# Patient Record
Sex: Female | Born: 1955 | Race: White | Hispanic: No | Marital: Married | Smoking: Never smoker
Health system: Southern US, Community
[De-identification: ages and names within clinical notes are randomized; demographics above are authoritative.]

## PROBLEM LIST (undated history)

## (undated) DIAGNOSIS — H9190 Unspecified hearing loss, unspecified ear: Secondary | ICD-10-CM

## (undated) DIAGNOSIS — J309 Allergic rhinitis, unspecified: Secondary | ICD-10-CM

## (undated) DIAGNOSIS — E079 Disorder of thyroid, unspecified: Secondary | ICD-10-CM

## (undated) DIAGNOSIS — I2510 Atherosclerotic heart disease of native coronary artery without angina pectoris: Secondary | ICD-10-CM

## (undated) DIAGNOSIS — J329 Chronic sinusitis, unspecified: Secondary | ICD-10-CM

## (undated) DIAGNOSIS — R519 Headache, unspecified: Secondary | ICD-10-CM

## (undated) DIAGNOSIS — I38 Endocarditis, valve unspecified: Secondary | ICD-10-CM

## (undated) DIAGNOSIS — E785 Hyperlipidemia, unspecified: Secondary | ICD-10-CM

## (undated) DIAGNOSIS — I10 Essential (primary) hypertension: Secondary | ICD-10-CM

## (undated) DIAGNOSIS — E669 Obesity, unspecified: Secondary | ICD-10-CM

## (undated) DIAGNOSIS — R12 Heartburn: Secondary | ICD-10-CM

## (undated) DIAGNOSIS — E7800 Pure hypercholesterolemia, unspecified: Secondary | ICD-10-CM

## (undated) HISTORY — PX: HX CATARACT REMOVAL: SHX102

## (undated) HISTORY — PX: COCHLEAR IMPLANT: SUR684

## (undated) HISTORY — DX: Essential (primary) hypertension: I10

## (undated) HISTORY — PX: HX TONSILLECTOMY: SHX27

## (undated) HISTORY — DX: Chronic sinusitis, unspecified: J32.9

## (undated) HISTORY — PX: HX EYE SURGERY: 2100001143

## (undated) HISTORY — DX: Unspecified hearing loss, unspecified ear: H91.90

## (undated) HISTORY — DX: Allergic rhinitis, unspecified: J30.9

## (undated) HISTORY — PX: HX WISDOM TEETH EXTRACTION: SHX21

## (undated) HISTORY — PX: HX COLONOSCOPY: 2100001147

## (undated) HISTORY — DX: Headache, unspecified: R51.9

## (undated) HISTORY — PX: HX HYSTERECTOMY: SHX81

## (undated) HISTORY — DX: Disorder of thyroid, unspecified: E07.9

## (undated) HISTORY — PX: IMPLANTATION BONE ANCHORED HEARING AID: SUR691

## (undated) HISTORY — DX: Atherosclerotic heart disease of native coronary artery without angina pectoris: I25.10

## (undated) HISTORY — PX: OTHER SURGICAL HISTORY: SHX169

## (undated) HISTORY — PX: PARTIAL HYSTERECTOMY: SHX80

---

## 2000-12-24 ENCOUNTER — Other Ambulatory Visit (HOSPITAL_COMMUNITY): Payer: Self-pay | Admitting: Family

## 2013-05-30 ENCOUNTER — Encounter (INDEPENDENT_AMBULATORY_CARE_PROVIDER_SITE_OTHER): Payer: Self-pay | Admitting: Audiologist

## 2013-05-30 ENCOUNTER — Ambulatory Visit (INDEPENDENT_AMBULATORY_CARE_PROVIDER_SITE_OTHER): Payer: BC Managed Care – PPO | Admitting: Audiologist

## 2013-05-30 ENCOUNTER — Ambulatory Visit (INDEPENDENT_AMBULATORY_CARE_PROVIDER_SITE_OTHER): Payer: BC Managed Care – PPO | Admitting: Otolaryngology

## 2013-05-30 ENCOUNTER — Encounter (INDEPENDENT_AMBULATORY_CARE_PROVIDER_SITE_OTHER): Payer: Self-pay | Admitting: Otolaryngology

## 2013-05-30 VITALS — BP 136/86 | HR 73 | Temp 98.7°F | Ht <= 58 in | Wt 160.4 lb

## 2013-05-30 DIAGNOSIS — H9190 Unspecified hearing loss, unspecified ear: Secondary | ICD-10-CM

## 2013-05-30 DIAGNOSIS — R4789 Other speech disturbances: Secondary | ICD-10-CM

## 2013-05-30 DIAGNOSIS — H903 Sensorineural hearing loss, bilateral: Secondary | ICD-10-CM

## 2013-05-30 DIAGNOSIS — H905 Unspecified sensorineural hearing loss: Principal | ICD-10-CM

## 2013-05-30 DIAGNOSIS — H93299 Other abnormal auditory perceptions, unspecified ear: Secondary | ICD-10-CM
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
## 2013-05-30 NOTE — Progress Notes (Signed)
Patient was sent over today by Dr Fonnie Mu to discuss a Baha hearing device. Using the softband, I placed the BP110 Power processor behind the patient's left ear. I plugged her right ear with a foam insert. I had her husband talk to her on the left side. She reports that if he was on her left side before, she would turn her head so that her right ear was facing toward him more. He was not talking very loud, below conversational level and she was able to hear him well. He even stepped just to the door, about five feet away, and talked to her. She was able to hear him without turning her head. She was impressed with the device. She is going to think about the device. She is a little concerned about the surgical side of the process. I encouraged her to call and speak the the physician about questions with the surgery. I will check with Ginger Organ about insurance and call the patient back. She is to let us know what she decides about the device.    TRH

## 2013-05-31 ENCOUNTER — Encounter (INDEPENDENT_AMBULATORY_CARE_PROVIDER_SITE_OTHER): Payer: Self-pay | Admitting: Audiologist

## 2013-05-31 NOTE — Progress Notes (Signed)
I spoke with patient this morning. Yesterday, she had questions about insurance coverage. I spoke with Ginger Organ at the front desk and she states that she is not the one that does pre certification. She said that once the patient decides to proceed, she would get her scheduled for surgery and then someone else calls to verify coverage. Kelly recommended that the patient call her insurance on her own and find out if she is covered for the surgery. I also spoke with Dr Jocelyn Lamer about processor types and he recommended the Baha 5 device for this patient. Patient said that she would talk to her regular ear doctor about the surgery and also call her insurance to see what they say. She is going to call us back and let us know what she decides. Since the power processor is not needed, patient states that she is interested in the Attract system, not the Connect system.     TRH

## 2013-06-05 ENCOUNTER — Ambulatory Visit (INDEPENDENT_AMBULATORY_CARE_PROVIDER_SITE_OTHER): Payer: Self-pay | Admitting: Audiologist

## 2013-06-05 ENCOUNTER — Encounter (INDEPENDENT_AMBULATORY_CARE_PROVIDER_SITE_OTHER): Payer: Self-pay | Admitting: Otolaryngology

## 2013-06-05 NOTE — H&P (Signed)
[facility], [place]  [place] [address]  [phone]    PATIENT NAME:  Molly [name]  MRN:  [id number]  DOB:  December 28, 1955  DATE OF SERVICE: 05/30/2013    Chief Complaint:  Hearing Loss    HPI:  Nialah Saravia is a 58 y.o. female who was sent here for consultation by Dr. Joya Martyr because of profound hearing loss in the right ear.  She states that she lost 70% of her hearing in 1988 and the hearing progressed after that although in parts of her chart it mentioned she had sudden hearing loss at that time.  She notices occasional unsteadiness.  She denies problems hearing out of her right ear.  She notices constant tinnitus on the left side.      Past Medical History:  Past Medical History   Diagnosis Date    Allergic rhinitis     HTN (hypertension)     Thyroid disease     Hearing loss     Coronary artery disease        Past Surgical History:  Past Surgical History   Procedure Laterality Date    Hx tonsillectomy         Family History:  Family History   Problem Relation Age of Onset    Bleeding Prob Mother     Cancer Mother     Diabetes Mother     Heart Disease Mother     Hypertension Mother        Social History:  History   Smoking status    Never Smoker    Smokeless tobacco    Never Used     History   Alcohol Use No     Social History     Occupational History    Not on file.       Medications:  Outpatient Prescriptions Marked as Taking for the 05/30/13 encounter (Office Visit) with Eldred Manges, MD   Medication Sig    aspirin (ECOTRIN) 81 mg Oral Tablet, Delayed Release (E.C.) Take 81 mg by mouth Once a day    Atenolol-Chlorthalidone (TENORETIC) 50-25 mg Oral Tablet     clopidogrel (PLAVIX) 75 mg Oral Tablet     hydrOXYzine pamoate (VISTARIL) 25 mg Oral Capsule Take 25 mg by mouth Three times a day as needed for Itching    levothyroxine (SYNTHROID) 88 mcg Oral Tablet     LUTEIN ORAL Take by mouth    multivitamin Oral Tablet Take 1 Tab by  mouth Once a day    Omega-3 Fatty Acids-Vitamin E (FISH OIL) 1,000 mg Oral Capsule Take 1,000 mg by mouth Twice daily    potassium chloride (MICRO K) 10 mEq Oral Capsule, Sustained Release     pravastatin (PRAVACHOL) 40 mg Oral Tablet        Allergies:  Allergies   Allergen Reactions    Bactrim [Sulfamethoxazole-Trimethoprim] Rash    Ciprofloxacin Rash       Review of Systems:  Do you have any fevers: no   Any weight change: no   Change in your vision: no    Chest Pain: yes   Shortness of Breath: no   Stomach pain: no   Urinary difficulity: no   Joint Pain: no   Skin Problems: no   Weakness or Numbness: no   Easy Bruising or Bleeding: yes Explain Bruising or Bleeding: bruising aspirin  Excessive Thirst: no   Seasonal Allergies: yes    All  other systems reviewed and found to be negative.    Physical Exam:  Blood pressure 136/86, pulse 73, temperature 37.1 C (98.7 F), height 1.46 m (4' 9.48"), weight 72.757 kg (160 lb 6.4 oz), SpO2 95.00%.  Body mass index is 34.13 kg/(m^2).  General Appearance: Pleasant, cooperative, healthy, and in no acute distress.  Eyes: Conjunctivae/corneas clear, PERRLA, EOM's intact.  Head and Face: Normocephalic, atraumatic.  Face symmetric, no obvious lesions.   Pinnae: Normal shape and position.   External auditory canals:  Patent without inflammation.  Tympanic membranes:  Intact, translucent, midposition, middle ear aerated.  Nose:  External pyramid midline. Septum midline. Mucosa normal. No purulence, polyps, or crusts.   Oral Cavity/Oropharynx: No mucosal lesions, masses, or pharyngeal asymmetry.  Neck:  No palpable thyroid, salivary gland, or neck masses.  Heme/Lymph:  No cervical adenopathy.  Cardiovascular:  Good perfusion of upper extremities.  No cyanosis of the hands or fingers.  Lungs: No apparent stridorous breathing. No acute distress.  Skin: Skin warm and dry.  Neurologic: Cranial nerves:  grossly intact.  Romberg, gait and Tandem gait are normal.   Psychiatric:  Alert  and oriented x 3.    Procedure:  No notes on file    Data Reviewed:  Audiogram today shows normal hearing in the right ear but a profound sensorineural hearing loss in the left ear with Type A tympanograms bilaterally.      She recently had a MRI scan performed near her home.  We were able to obtain those images and I reviewed the MRI scan.  There was no evidence of an acoustic neuroma.     Assessment:    (1) Profound sensorineural hearing loss, left ear.     Plan:    (1) I talked to her about the options of trying to improve her hearing consisting of a CROS aid or a BAHA.  I sent her to our audiologist to have her try a BAHA headband but I am not sure of the results of that encounter.  She was not a cochlear implant candidate because of her excellent hearing in the right ear.  Return p.r.n.     Eldred Manges, MD    SDR  04.06.15    Copy To: Lynnell Dike., MD

## 2013-06-05 NOTE — Telephone Encounter (Signed)
Message copied by Lelan Pons on Mon Jun 05, 2013  9:57 AM  ------       Message from: Berlin Hun       Created: Mon Jun 05, 2013  9:49 AM         >> Tula Nakayama [id number] 06/05/2013 09:49 AM       Zainah Steven       Pt spoke with her insurance. She said that they said it look to be good but they are asking for our office to call them as we have more information.       Please call pt at [phone] or [phone] or [phone]       Thanks   ------

## 2013-06-05 NOTE — Telephone Encounter (Signed)
I spoke with patient today. She would like to go ahead and proceed with the [place]. I let her know that Claiborne Billings would call her and set up the date for surgery. She is not sure if she is going to do it before or after the summer. She is going out of town for most of the summer. She realizes that she will need to follow-up with Dr Fonnie Mu after surgery a couple of times before activation of the processor. I told her that she could talk to the surgical scheduler and find out how soon she could be scheduled. She reports that if needed, she could delay her trip some. Patient is interested in the [employer] system. Once I know a surgery date, I will fill out the order form and give it to [place].      TRH

## 2013-06-19 ENCOUNTER — Ambulatory Visit
Admission: RE | Admit: 2013-06-19 | Discharge: 2013-06-19 | Disposition: A | Discharge status: Home / Self Care | Payer: BC Managed Care – PPO | Source: Ambulatory Visit | Attending: Otolaryngology | Admitting: Otolaryngology

## 2013-06-19 ENCOUNTER — Ambulatory Visit (HOSPITAL_BASED_OUTPATIENT_CLINIC_OR_DEPARTMENT_OTHER): Payer: BC Managed Care – PPO | Admitting: Otolaryngology

## 2013-06-19 ENCOUNTER — Encounter (HOSPITAL_COMMUNITY): Payer: Self-pay

## 2013-06-19 ENCOUNTER — Ambulatory Visit (INDEPENDENT_AMBULATORY_CARE_PROVIDER_SITE_OTHER): Payer: BC Managed Care – PPO | Admitting: Otolaryngology

## 2013-06-19 ENCOUNTER — Encounter (INDEPENDENT_AMBULATORY_CARE_PROVIDER_SITE_OTHER): Payer: Self-pay | Admitting: Otolaryngology

## 2013-06-19 VITALS — BP 113/75 | HR 69 | Temp 96.4°F | Ht <= 58 in | Wt 159.6 lb

## 2013-06-19 VITALS — BP 130/68 | HR 78 | Temp 99.7°F | Ht <= 58 in | Wt 159.6 lb

## 2013-06-19 VITALS — BP 112/66 | HR 70 | Temp 98.7°F | Resp 18 | Wt 158.0 lb

## 2013-06-19 DIAGNOSIS — R4789 Other speech disturbances: Secondary | ICD-10-CM

## 2013-06-19 DIAGNOSIS — H918X9 Other specified hearing loss, unspecified ear: Secondary | ICD-10-CM

## 2013-06-19 DIAGNOSIS — H93299 Other abnormal auditory perceptions, unspecified ear: Secondary | ICD-10-CM

## 2013-06-19 DIAGNOSIS — Z01810 Encounter for preprocedural cardiovascular examination: Secondary | ICD-10-CM

## 2013-06-19 DIAGNOSIS — H905 Unspecified sensorineural hearing loss: Secondary | ICD-10-CM

## 2013-06-19 DIAGNOSIS — E079 Disorder of thyroid, unspecified: Secondary | ICD-10-CM | POA: Insufficient documentation

## 2013-06-19 DIAGNOSIS — I2510 Atherosclerotic heart disease of native coronary artery without angina pectoris: Secondary | ICD-10-CM | POA: Insufficient documentation

## 2013-06-19 DIAGNOSIS — J309 Allergic rhinitis, unspecified: Secondary | ICD-10-CM | POA: Insufficient documentation

## 2013-06-19 DIAGNOSIS — H903 Sensorineural hearing loss, bilateral: Secondary | ICD-10-CM

## 2013-06-19 DIAGNOSIS — I10 Essential (primary) hypertension: Secondary | ICD-10-CM

## 2013-06-19 DIAGNOSIS — H9192 Unspecified hearing loss, left ear: Secondary | ICD-10-CM

## 2013-06-19 DIAGNOSIS — Z7982 Long term (current) use of aspirin: Secondary | ICD-10-CM | POA: Insufficient documentation

## 2013-06-19 HISTORY — DX: Hyperlipidemia, unspecified: E78.5

## 2013-06-19 HISTORY — DX: Heartburn: R12

## 2013-06-19 HISTORY — DX: Endocarditis, valve unspecified: I38

## 2013-06-19 HISTORY — DX: Disorder of thyroid, unspecified: E07.9

## 2013-06-19 HISTORY — DX: Obesity, unspecified: E66.9

## 2013-06-19 HISTORY — DX: Essential (primary) hypertension: I10

## 2013-06-19 LAB — BASIC METABOLIC PANEL
ANION GAP: 14 mmol/L — ABNORMAL HIGH (ref 4–13)
BUN/CREAT RATIO: 20 (ref 6–22)
BUN: 15 mg/dL (ref 8–25)
CALCIUM: 9.7 mg/dL (ref 8.5–10.4)
CALCIUM: 9.7 mg/dL (ref 8.5–10.4)
CARBON DIOXIDE: 24 mmol/L (ref 22–32)
CARBON DIOXIDE: 24 mmol/L (ref 22–32)
CHLORIDE: 108 mmol/L (ref 96–111)
CREATININE: 0.74 mg/dL (ref 0.49–1.10)
ESTIMATED GLOMERULAR FILTRATION RATE: >59 ml/min/1.73m2 (ref >59)
GLUCOSE,NONFAST: 114 mg/dL (ref 65–139)
POTASSIUM: 3 mmol/L — ABNORMAL LOW (ref 3.5–5.1)
SODIUM: 146 mmol/L — ABNORMAL HIGH (ref 136–145)

## 2013-06-19 LAB — PERFORM POC WHOLE BLOOD GLUCOSE: GLUCOSE, POINT OF CARE: 121 mg/dL — ABNORMAL HIGH (ref 70–105)

## 2013-06-19 NOTE — Anesthesia Preprocedure Evaluation (Addendum)
Physical Exam:     Airway       Mallampati: II      Neck ROM: full  Mouth Opening: good.            Dental       Dentition intact             Pulmonary    Breath sounds clear to auscultation       Cardiovascular    Rhythm: regular  Rate: Normal       Other findings            Anesthesia Plan:  Planned anesthesia type: MAC  ASA 2     Intravenous induction   Patient's NPO status is appropriate for Anesthesia.    Anesthetic plan and risks discussed with patient.    Anesthesia issues/risks discussed are: PONV, Difficult Airway, Intraoperative Awareness/ Recall, Aspiration, Cardiac Events/MI, Post-op Cognitive Dysfunction, Stroke and Blood Loss.        Plan discussed with CRNA.                    EKG Ordered: 06/19/2013  CXR: Not ordered  Other Studies: labs pending    Release of info faxed for cardiac records.  Echo already complete. Stress scheduled for 07/17/13.  Reviewed with Dr. Blanch Media.  Patient to have stress test prior to surgery.  Notified Lori in ENT and patient on 06/19/13.    Consults: Cariology, Dr. Rosary Lively in [place], [place]. [phone]  Stress test 06/28/13:  Negative for ischemia.  (Scanned in).    STOP BANG Score (0-8):  4    Patient instructed to take the following medications day of surgery, atenolol, synthroid.  Instructed to take beta blocker am of surgery.  Instructions for blood thinners per service.  Instructed to continue statin therapy, including morning of surgery if normally takes in morning.        Copy of anesthesia consent provided to patient.  Instructed patient to review the consent prior to surgery.  Also informed patient that they will be signing a consent with their anesthesia provider the day of surgery.
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
## 2013-06-28 ENCOUNTER — Ambulatory Visit (INDEPENDENT_AMBULATORY_CARE_PROVIDER_SITE_OTHER): Payer: Self-pay | Admitting: Otolaryngology

## 2013-06-28 NOTE — Telephone Encounter (Signed)
Per Dr. Landis Gandy this is ok. Patient made aware. Verbalized understanding.

## 2013-06-28 NOTE — Telephone Encounter (Signed)
-----   Message from Ardeen Garland sent at 06/28/2013 12:27 PM EDT -----  >> NICOLE LEE [employer] 06/28/2013 12:27 PM  [facility] pt                       PT is calling stating that she is scheduled to go to the dentist the day before her surgery---the dentist makes her take an antibiotic before due to a condition she has---she is needing to know if she needs to reschedule her dentist appt, or if it would be ok. Please call and advise. Thanks!

## 2013-06-30 ENCOUNTER — Ambulatory Visit (INDEPENDENT_AMBULATORY_CARE_PROVIDER_SITE_OTHER): Payer: Self-pay | Admitting: Otolaryngology

## 2013-06-30 NOTE — Telephone Encounter (Signed)
-----   Message from Amalia Hailey sent at 06/30/2013 10:59 AM EDT -----  Regarding: question about meds  >> NANCY CLINE 06/30/2013 10:59 AM  Dr Landis Gandy: Pt says she got her list of meds not to take before her surgery and she is questioning whether to take her potassium. Call to let her know. Thank you

## 2013-06-30 NOTE — Telephone Encounter (Signed)
Message forwarded to Dr. Cassis.

## 2013-07-03 ENCOUNTER — Ambulatory Visit (INDEPENDENT_AMBULATORY_CARE_PROVIDER_SITE_OTHER): Payer: Self-pay | Admitting: Otolaryngology

## 2013-07-03 NOTE — Telephone Encounter (Signed)
Contacted patient, per Dr. Landis Gandy she is ok to take potassium until day of surgery

## 2013-07-03 NOTE — Telephone Encounter (Signed)
-----   Message from Hilliard Clark, MD sent at 07/03/2013  9:13 AM EDT -----  Regarding: RE: question about meds  >> NANCY CLINE 06/30/2013 10:59 AM  Dr Landis Gandy: Pt says she got her list of meds not to take before her surgery and she is questioning whether to take her potassium. Call to let her know. Thank you

## 2013-07-12 ENCOUNTER — Encounter (HOSPITAL_BASED_OUTPATIENT_CLINIC_OR_DEPARTMENT_OTHER): Payer: BC Managed Care – PPO | Admitting: Otolaryngology

## 2013-07-12 ENCOUNTER — Ambulatory Visit (HOSPITAL_BASED_OUTPATIENT_CLINIC_OR_DEPARTMENT_OTHER): Payer: BC Managed Care – PPO | Admitting: CERTIFIED REGISTERED NURSE ANESTHETIST-CRNA

## 2013-07-12 ENCOUNTER — Encounter (HOSPITAL_COMMUNITY): Payer: Self-pay

## 2013-07-12 ENCOUNTER — Encounter (HOSPITAL_COMMUNITY)
Admission: RE | Disposition: A | Discharge status: Home / Self Care | Payer: Self-pay | Source: Ambulatory Visit | Attending: Otolaryngology

## 2013-07-12 ENCOUNTER — Inpatient Hospital Stay
Admission: RE | Admit: 2013-07-12 | Discharge: 2013-07-12 | Disposition: A | Discharge status: Home / Self Care | Payer: BC Managed Care – PPO | Source: Ambulatory Visit | Attending: Otolaryngology | Admitting: Otolaryngology

## 2013-07-12 ENCOUNTER — Encounter (HOSPITAL_COMMUNITY): Payer: BC Managed Care – PPO | Admitting: Registered Nurse

## 2013-07-12 DIAGNOSIS — H918X9 Other specified hearing loss, unspecified ear: Secondary | ICD-10-CM

## 2013-07-12 DIAGNOSIS — E669 Obesity, unspecified: Secondary | ICD-10-CM | POA: Insufficient documentation

## 2013-07-12 DIAGNOSIS — J309 Allergic rhinitis, unspecified: Secondary | ICD-10-CM | POA: Insufficient documentation

## 2013-07-12 DIAGNOSIS — I10 Essential (primary) hypertension: Secondary | ICD-10-CM | POA: Insufficient documentation

## 2013-07-12 DIAGNOSIS — I38 Endocarditis, valve unspecified: Secondary | ICD-10-CM | POA: Insufficient documentation

## 2013-07-12 DIAGNOSIS — Z79899 Other long term (current) drug therapy: Secondary | ICD-10-CM | POA: Insufficient documentation

## 2013-07-12 DIAGNOSIS — I2510 Atherosclerotic heart disease of native coronary artery without angina pectoris: Secondary | ICD-10-CM | POA: Insufficient documentation

## 2013-07-12 DIAGNOSIS — E079 Disorder of thyroid, unspecified: Secondary | ICD-10-CM | POA: Insufficient documentation

## 2013-07-12 DIAGNOSIS — E785 Hyperlipidemia, unspecified: Secondary | ICD-10-CM | POA: Insufficient documentation

## 2013-07-12 SURGERY — IMPLANT EAR BAHA
Anesthesia: Monitor Anesthesia Care | Laterality: Left | Wound class: Clean Wound: Uninfected operative wounds in which no inflammation occurred

## 2013-07-12 MED ORDER — HYDROCODONE 5 MG-ACETAMINOPHEN 325 MG TABLET
1.00 | ORAL_TABLET | ORAL | Status: DC | PRN
Start: 2013-07-12 — End: 2013-08-28

## 2013-07-12 MED ORDER — SODIUM BICARBONATE 1 MEQ/ML (8.4 %) INTRAVENOUS SOLUTION
Freq: Once | INTRAVENOUS | Status: DC | PRN
Start: 2013-07-12 — End: 2013-07-12

## 2013-07-12 MED ORDER — HYDROCODONE 5 MG-ACETAMINOPHEN 325 MG TABLET
1.0000 | ORAL_TABLET | ORAL | Status: DC | PRN
Start: 2013-07-12 — End: 2013-07-12
  Administered 2013-07-12: 1 via ORAL

## 2013-07-12 MED ORDER — SODIUM CHLORIDE 0.9 % (FLUSH) INJECTION SYRINGE
2.00 mL | INJECTION | INTRAMUSCULAR | Status: DC | PRN
Start: 2013-07-12 — End: 2013-07-12

## 2013-07-12 MED ORDER — LACTATED RINGERS INTRAVENOUS SOLUTION
INTRAVENOUS | Status: DC
Start: 2013-07-12 — End: 2013-07-12
  Administered 2013-07-12: 0 via INTRAVENOUS

## 2013-07-12 MED ORDER — SODIUM CHLORIDE 0.9 % INTRAVENOUS SOLUTION
Freq: Once | INTRAVENOUS | Status: DC | PRN
Start: 2013-07-12 — End: 2013-07-12

## 2013-07-12 MED ORDER — GELATIN SPONGE,ABSORBABLE-PORCINE SKIN 100 CM TOPICAL SPONGE
VAGINAL_SPONGE | Freq: Once | CUTANEOUS | Status: DC | PRN
Start: 2013-07-12 — End: 2013-07-12

## 2013-07-12 MED ORDER — CEPHALEXIN 500 MG CAPSULE
500.00 mg | ORAL_CAPSULE | Freq: Three times a day (TID) | ORAL | Status: AC
Start: 2013-07-12 — End: 2013-07-26

## 2013-07-12 MED ORDER — CEFAZOLIN 10 GRAM SOLUTION FOR INJECTION
2.0000 g | Freq: Once | INTRAMUSCULAR | Status: DC
Start: 2013-07-12 — End: 2013-07-12

## 2013-07-12 MED ORDER — SODIUM CHLORIDE 0.9 % (FLUSH) INJECTION SYRINGE
2.00 mL | INJECTION | Freq: Three times a day (TID) | INTRAMUSCULAR | Status: DC
Start: 2013-07-12 — End: 2013-07-12

## 2013-07-12 MED ORDER — HYDROCODONE 5 MG-ACETAMINOPHEN 325 MG TABLET
ORAL_TABLET | ORAL | Status: DC
Start: 2013-07-12 — End: 2013-07-12
  Filled 2013-07-12: qty 1

## 2013-07-12 MED ORDER — FENTANYL (PF) 50 MCG/ML INJECTION SOLUTION
Freq: Once | INTRAMUSCULAR | Status: DC | PRN
Start: 2013-07-12 — End: 2013-07-12
  Administered 2013-07-12: 50 ug via INTRAVENOUS
  Administered 2013-07-12 (×2): 25 ug via INTRAVENOUS

## 2013-07-12 MED ORDER — CEFAZOLIN 1 GRAM SOLUTION FOR INJECTION
Freq: Once | INTRAMUSCULAR | Status: DC | PRN
Start: 2013-07-12 — End: 2013-07-12
  Administered 2013-07-12: 2000 mg via INTRAVENOUS

## 2013-07-12 MED ORDER — BACITRACIN 500 UNIT/G OINTMENT TUBE
TOPICAL_OINTMENT | Freq: Once | CUTANEOUS | Status: DC | PRN
Start: 2013-07-12 — End: 2013-07-12
  Filled 2013-07-12: qty 28.4

## 2013-07-12 MED ORDER — MIDAZOLAM 1 MG/ML INJECTION SOLUTION
Freq: Once | INTRAMUSCULAR | Status: DC | PRN
Start: 2013-07-12 — End: 2013-07-12
  Administered 2013-07-12: 2 mg via INTRAVENOUS

## 2013-07-12 MED ORDER — LIDOCAINE 1 %-EPINEPHRINE 1:100,000 INJECTION SOLUTION
15.00 mL | Freq: Once | INTRAMUSCULAR | Status: DC | PRN
Start: 2013-07-12 — End: 2013-07-12
  Administered 2013-07-12: 150 mg via INTRAMUSCULAR

## 2013-07-12 SURGICAL SUPPLY — 52 items
ADH LIQUID LF  WTPRF VIAL PREP NONSTAIN MASTISOL STYRAX GUM MASTIC ALC MTHY SLCYT STRL CLR NHZR 2/3 (SEALANTS) ×1 IMPLANT
ADH LQ LF VIAL AMP PREP MASTI_SOL STYRAX GUM MASTIC ALC MTHY (SEALANTS) ×1
BAHA ONE STAGE PROCEDURE KIT (Other) ×1 IMPLANT
BLANKET 3M BAIR HUG ADLT LWR B ODY 60X36IN PLMR AIR SYS LTWT (MISCELLANEOUS PT CARE ITEMS) ×2 IMPLANT
CATH IV 20GA 1.16IN SHIELD NTCH NEEDLE PSHBTN DEHP-FR BD VLN INST ATGRD PERI STD STRL LF  DISP PNK (IV TUBING & ACCESSORIES) ×2 IMPLANT
CATH IV 20GA 1.16IN SHLD NTCH_NEEDLE PSHBTN DEHP-FR BD VLN (IV TUBING & ACCESSORIES) ×2
COCHLEAR CONICAL GUIDE DRILL REF.#  93363 ×2 IMPLANT
COCHLEAR HEALING CAP REF. #92138 ×1 IMPLANT
COCHLEAR WIDENING DRILL REF.# 92141 ×1 IMPLANT
CONV USE 23866 - NEEDLE HYPO 27GA 1.5IN STD MONOJECT SS POLYPROP REG BVL LL HUB UL SHRP ANTICORE YW STRL LF  DISP (NEEDLES & SYRINGE SUPPLIES) ×3 IMPLANT
CORD BIPOLAR FOOTSWITCH 12FT E0509 50EA/CS STRL DISP (CAUTERY SUPPLIES) ×2 IMPLANT
Cochlear BA400 abutment 12mm IMPLANT
Cochlear BIA400 implant 4mm with abutment 12 mm ×1 IMPLANT
DISC USE ITEM 163322 - SYRINGE LL 20ML LTX STRL MED (NEEDLES & SYRINGE SUPPLIES) ×2 IMPLANT
DISCONTINUED USE ITEM 61800 - SUTURE 3-0 PS2 VICRYL MTPS 27IN UNDYED BRD COAT ABS (SUTURE/WOUND CLOSURE) ×1 IMPLANT
DRAPE 2 INCS FILM ANTIMIC 23X17IN IOBN STRL SURG (PROTECTIVE PRODUCTS/GARMENTS) ×1 IMPLANT
DRAPE 2 INCS FILM ANTIMIC 23X1_7IN IOBN STRL SURG (PROTECTIVE PRODUCTS/GARMENTS) ×1
DRAPE ADH STRP LRG TWL 23X17IN_STRDRP LF STRL DISP SURG (PROTECTIVE PRODUCTS/GARMENTS) ×2
DRAPE TWL PLASTIC ADH 23X17IN LRG STRDRP STRL SURG TRNSPR (PROTECTIVE PRODUCTS/GARMENTS) ×2 IMPLANT
DRESS EAR 5.5IN GLSCK ADULT LF (WOUND CARE SUPPLY) ×1 IMPLANT
DRESSING XEROFORM 5 X 9IN 8884433605 50/BX 4BX/CS (WOUND CARE SUPPLY) ×1 IMPLANT
DRESSING XEROFORM 5 X 9IN 8884433605 50/BX 4BX/CS (WOUND CARE/ENTEROSTOMAL SUPPLY) ×1
DUPE USE ITEM 319500 - SUTURE 3-0 PC5 ETHILON MTPS 18_IN BLK MONOF NONAB (SUTURE/WOUND CLOSURE) ×1 IMPLANT
ELECTRODE ESURG BLADE PNCL 10FT VLAB STRL SS DISP BUTTON SWH HEX LOCK CORD HLSTR LF  ACPT 3/32IN STD (CAUTERY SUPPLIES) ×1 IMPLANT
ELECTRODE ESURG BLADE PNCL 10F_T VLAB STRL SS DISP BUTTON SWH (CAUTERY SUPPLIES) ×1
GOWN SURG XL L3 NONREINFORCE HKLP CLSR SET IN SLEEVE STRL LF  DISP BLU SIRUS SMS 47IN (DGOW) ×1 IMPLANT
GOWN SURG XL L3 NONREINFORCE H_KLP CLSR SET IN SLEEVE STRL LF (DGOW) ×1
KIT DRESS GLSCK 5.5IN ADLT EAR_LF (WOUND CARE/ENTEROSTOMAL SUPPLY) ×1
KIT RM TURNOVER CLEANOP CSTM INFCT CONTROL (KITS & TRAYS (DISPOSABLE)) ×1
KIT RM TURNOVER CLEANOP CSTM I_NFCT CONTROL (KITS & TRAYS (DISPOSABLE)) ×1
KIT RM TURNOVER CLEANOP CUSTOM INFCT CONTROL (KITS & TRAYS (DISPOSABLE)) ×1 IMPLANT
KIT SETUP MINOR SURGICAL (KITS & TRAYS (DISPOSABLE)) ×2 IMPLANT
NEEDLE HYPO  27GA 1.5IN STD MONOJECT SS POLYPROP REG BVL LL (NEEDLES & SYRINGE SUPPLIES) ×3
PACK SURG ECLIPSE EENT II TBL CVR SUT BAG HEAD TRBN DRP 90X50IN 40X27IN LF (DRAPE/PACKS/SHEETS/OR TOWEL) ×1 IMPLANT
PACK SURG EENT II DYNJP7010 (DRAPE/PACKS/SHEETS/OR TOWEL) ×1
PAD ARMBOARD FOAM BLU_FP-ECARM (POSITIONING PRODUCTS) ×2
PAD ARMBRD BLU (POSITIONING PRODUCTS) ×2 IMPLANT
PEN SURG MRKNG WRITESITE + RLR LBL SET 3X DARKER FORMULATE GNTN VIOL INK STRL LF  CHLRPRP (MISCELLANEOUS PT CARE ITEMS) ×2 IMPLANT
PEN SURG MRKNG WRITESITE + SKN_RLR LBL SET 3X DARKER (MISCELLANEOUS PT CARE ITEMS) ×2
SOUND PROCESSOR (Other) ×2 IMPLANT
SUTURE 3-0 PC5 ETHILON MTPS 18_IN BLK MONOF NONAB (SUTURE/WOUND CLOSURE) ×1
SUTURE 3-0 PS2 VICRYL MTPS 27I_N UNDYED BRD COAT ABS (SUTURE/WOUND CLOSURE) ×2
SYRINGE BD LL 10ML LF STRL CO_NTROL CONCEN TIP PRGN FREE (NEEDLES & SYRINGE SUPPLIES) ×1
SYRINGE HYPO 10CC LL 309604 100/BX (Syringes w/ o Needles) ×2 IMPLANT
SYRINGE LL 10ML LF  STRL CONTROL CONCEN TIP PRGN FREE DEHP-FR MED DISP (NEEDLES & SYRINGE SUPPLIES) ×1 IMPLANT
SYRINGE LL 20ML LTX STRL MED (NEEDLES & SYRINGE SUPPLIES) ×2
TRAY SKIN SCRUB 8IN VNYL COTTON 6 WNG 6 SPONGE STICK 2 TIP APPL DRY STRL LF (KITS & TRAYS (DISPOSABLE)) ×1 IMPLANT
TRAY SURG PREP SCR CR ESTM (KITS & TRAYS (DISPOSABLE)) ×1
TUBING SUCT CLR 20FT 9/32IN MEDIVAC NCDTV M/M CONN STRL LF (Suction) ×1 IMPLANT
TUBING SUCT CONN 20FT LONG_STRL N720A (Suction) ×1
WIPE 3X3IN LF  ULTRACELL SURG INSTR SOLAN THK2MM (CLEN) ×1 IMPLANT
WIPE 3X3IN LF ULTRACELL SURG_INSTR SOLAN THK2MM (CLEN) ×1

## 2013-07-12 NOTE — OR PostOp (Signed)
AVS and instructions reviewed with pt and spouse.  D/C'd via W/C, escorted by PCA

## 2013-07-12 NOTE — Discharge Instructions (Signed)
SURGICAL DISCHARGE INSTRUCTIONS     Dr. Landis Gandy, Omelia Blackwater, MD  performed your IMPLANT EAR BAHA today at the [facility]:  Monday through Friday from 6 a.m. - 7 p.m.: [phone]  Between 7 p.m. - 6 a.m., weekends and holidays:  Call [facility] at [phone] or [phone].    PLEASE SEE WRITTEN HANDOUTS AS DISCUSSED BY YOUR NURSE:  Ear Surgery    SIGNS AND SYMPTOMS OF A WOUND / INCISION INFECTION   Be sure to watch for the following:   Increase in redness or red streaks near or around the wound or incision.   Increase in pain that is intense or severe and cannot be relieved by the pain medication that your doctor has given you.   Increase in swelling that cannot be relieved by elevation of a body part, or by applying ice, if permitted.   Increase in drainage, or if yellow / green in color and smells bad. This could be on a dressing or a cast.   Increase in fever for longer than 24 hours, or an increase that is higher than 101 degrees Fahrenheit (normal body temperature is 98 degrees Fahrenheit). The incision may feel warm to the touch.    **CALL YOUR DOCTOR IF ONE OR MORE OF THESE SIGNS / SYMPTOMS SHOULD OCCUR.    ANESTHESIA INFORMATION   ANESTHESIA -- ADULT PATIENTS:  You have received intravenous sedation / general anesthesia, and you may feel drowsy and light-headed for several hours. You may even experience some forgetfulness of the procedure. DO NOT DRIVE A MOTOR VEHICLE or perform any activity requiring complete alertness or coordination until you feel fully awake in about 24-48 hours. Do not drink alcoholic beverages for at least 24 hours. Do not stay alone, you must have a responsible adult available to be with you. You may also experience a dry mouth or nausea for 24 hours. This is a normal side effect and will disappear as the effects of the medication wear off.    REMEMBER   If you experience any difficulty breathing, chest pain, bleeding that you feel is  excessive, persistent nausea or vomiting or for any other concerns:  Call your physician Dr. Landis Gandy at [phone] or [phone]. You may also ask to have the ENT doctor on call paged. They are available to you 24 hours a day.    SPECIAL INSTRUCTIONS / COMMENTS       FOLLOW-UP APPOINTMENTS   Please call patient services at [phone] or [phone] to schedule a date / time of return. They are open Monday - Friday from 7:30 am - 5:00 pm.

## 2013-07-12 NOTE — OR Surgeon (Addendum)
[facility]   OTOLARYNGOLOGY DEPARTMENT   BRIEF OPERATIVE NOTE    Name: Molly [name], 58 y.o. female  MRN: [id number]  DOB: 1956-03-02  Date of Surgery: 07/12/2013    Preoperative Diagnoses: Left sided hearing loss  Postoperative Diagnoses: Same  Procedure: Placement BAHA abutment, left ear  Surgeons: Domingo Mend  Anesthesia: MAC with local  Estimated Blood Loss: Minimal  Fluids: Crystalloids  Operative Findings: 7 mm thick skin flap, left scalp  Drains: None  Specimens: None  Complications: None  Condition: Stable  Disposition: PACU      Plan for DC today        Sharyn Dross, MD  07/12/2013, 10:43    Hilliard Clark, MD  07/12/2013, 11:07

## 2013-07-12 NOTE — Anesthesia Postprocedure Evaluation (Signed)
ANESTHESIA POSTOP EVALUATION NOTE        Anesthesia Service      [facility]     07/12/2013     Last Vitals: Temperature: 36.7 C (98.1 F) (07/12/13 1042)  Heart Rate: 70 (07/12/13 1042)  BP (Non-Invasive): 120/47 mmHg (07/12/13 1042)  Respiratory Rate: 15 (07/12/13 1042)  SpO2-1: 98 % (07/12/13 1042)  Pain Score (Numeric, Faces): 0 (07/12/13 1042)    Procedure(s):  IMPLANT EAR BAHA    Patient is sufficiently recovered from the effects of anesthesia to participate in the evaluation and has returned to their pre-procedure level.  I have reviewed and evaluated the following:  Respiratory Function: Consistent with pre anesthetic level  Cardiovascular Function: Consistent with pre anesthetic level  Mental Status: Return to pre anesthetic baseline level  Pain: Sufficiently controlled with medication  Nausea and Vomiting: Absent or sufficiently controlled with medication  Post-op Anesthetic Complications: None    Comment/ re-evaluation for any variations: None

## 2013-07-12 NOTE — OR Surgeon (Signed)
[facility] OF OTOLARYNGOLOGY                                     OPERATION SUMMARY    PATIENT NAME: Molly [name], Molly [name] [facility] [id number]  DATE OF SERVICE:07/12/2013  DATE OF BIRTH: 10-02-1955    PREOPERATIVE DIAGNOSIS:  Left single-sided deafness.    POSTOPERATIVE DIAGNOSIS:  Left single-sided deafness.    NAME OF PROCEDURE:  Left BAHA placement (osseo-integrated implant).    SURGEONS:  Roosvelt Harps MD (staff), Charletta Cousin, MD (resident).    ANESTHESIA:  Local/MAC.    ESTIMATED BLOOD LOSS:  Minimal.    SPECIMENS:  None.    COMPLICATIONS:  None.    OPERATIVE FINDINGS:  Successful placement of a 12-mm abutment loaded on 4-mm implant.    INDICATIONS FOR PROCEDURE:  A 58 year old female found to have left single-sided deafness.  MRI was normal.  The patient elected to proceed with BAHA placement for rehabilitation of her single-sided deafness.    DESCRIPTION OF PROCEDURE:  Informed consent was obtained.  The left ear was marked in the preoperative area after confirming left-sided surgery.  The patient was brought back to the operating room.  The patient was positioned 180 degrees away from anesthesia.  The patient was premedicated with Versed and fentanyl.  Implant location 5.5 cm posterior-superior to the external auditory canal was marked and hair in this area was removed.  A 3 cm anterior curvilinear incision was marked.  The soft tissue depth was measured and it was determined that a 12-mm abutment was necessary due to her thick scalp.  The surgical area was then infiltrated with 1% lidocaine with 1:100,000 epinephrine and buffered with sodium bicarbonate.  The left ear was cleaned, prepped and draped in a sterile fashion.    The skin incision was sharply incised and taken down to the level of the periosteum.  A cruciate incision was then performed and periosteum was raised.  The guide drill was used at a  depth of 3 mm to create a pilot hole.  The depth was palpated and there was good bone.  This was then deepened to 4 mm.  Again, there was good bone at the depth of the drill hole.  The 4 mm widening drill was then used to enlarge this pilot hole.  Again, there was good depth of bone at the bottom of the hole.  The 12-mm abutment on a 4-mm implant was loaded on the drill under a 40 Newton meter torque setting.  This was placed without difficulty.  The incision was closed with 3-0 Vicryl sutures.  A skin punch was used to create an opening to allow the abutment to come through the skin.  Bacitracin was applied to the wound edges.  Xeroform was placed around the abutment and the healing cap was applied.  A Glasscock pressure ear dressing was placed to the left ear.  The patient was then taken to the postop recovery room in stable condition.      Hilliard Clark, MD  Assistant Professor  [facility] Department of Otolaryngology    [id number]; D: 07/12/2013 10:54:15; T: 07/12/2013 15:22:01

## 2013-07-12 NOTE — Anesthesia Transfer of Care (Signed)
ANESTHESIA TRANSFER OF CARE NOTE        Anesthesia Service      [facility]         Last Vitals: Temperature: 36.7 C (98.1 F) (07/12/13 1042)  Heart Rate: 70 (07/12/13 1042)  BP (Non-Invasive): 120/47 mmHg (07/12/13 1042)  Respiratory Rate: 15 (07/12/13 1042)  SpO2-1: 98 % (07/12/13 1042)    Patient transferred to pacu  in stable condition. Report given to RN.    5/13/2015at 10:43.

## 2013-07-12 NOTE — H&P (Addendum)
[facility]  OTOLARYNGOLOGY DEPARTMENT  H&P Update    Date: 07/12/2013  Name: Molly [name], 58 y.o. female  MRN: [id number]  DOB: 03-26-1955      STOP: IF H&P IS GREATER THAN 30 DAYS FROM SURGICAL DAY COMPLETE NEW H&P IS REQUIRED.    Pre-Surgical H & P updated the day of the procedure.  1.  H&P the patient has been examined, and no change has occured in the patients condition since the H&P was completed.       Change in medications: No      Comments: See H&P completed by Dr. Landis Gandy 06/19/2013    2.  Patient continues to be appropiate candidate for planned surgical procedure. YES      Sharyn Dross, MD  07/12/2013, 09:10    Hilliard Clark, MD  07/12/2013, 11:07

## 2013-07-13 NOTE — Addendum Note (Signed)
Addendum created 07/13/13 0820 by Marily Lente, MD    Modules edited: Anesthesia Attestations

## 2013-07-17 ENCOUNTER — Ambulatory Visit (INDEPENDENT_AMBULATORY_CARE_PROVIDER_SITE_OTHER): Payer: Self-pay | Admitting: Otolaryngology

## 2013-07-17 NOTE — Telephone Encounter (Signed)
Patient concerned baha is "broken", plastic piece came off baha but not broken. Spoke with Dr. Landis Gandy, patient should be able to snap plastic piece back on. Patient verbalized understanding.

## 2013-07-17 NOTE — Telephone Encounter (Signed)
-----   Message from Dorann Ou sent at 07/17/2013 10:39 AM EDT -----  Regarding: Cassis  >> Dorann Ou 07/17/2013 10:39 AM  [name] states her BAHA is broken and she needs to speak with someone about it      Please call [name] at [phone] or [phone]

## 2013-07-17 NOTE — Telephone Encounter (Signed)
Contacted patient. Patient concerned about swelling to baha insertion site. No fever, no drainage, some tenderness but no severe pain. Advised per Dr. Donnetta Simpers to take ibuprofen for pain and swelling. If patient experiences signs or symptoms of infection to site contact clinic for sooner appointment, otherwise advised to keep 07/27/13 appointment. Verbalized understanding.

## 2013-07-17 NOTE — Telephone Encounter (Signed)
-----   Message from Berlin Hun sent at 07/14/2013  4:51 PM EDT -----  >> Tula Nakayama PYERITZ 07/14/2013 04:51 PM  Cassis  Pt is concerned that her BAJA site is swollen  Please call and advise [phone]  Thanks

## 2013-07-27 ENCOUNTER — Ambulatory Visit: Payer: BC Managed Care – PPO | Attending: Otolaryngology | Admitting: Otolaryngology

## 2013-07-27 VITALS — BP 130/70 | HR 78 | Temp 98.7°F | Ht <= 58 in | Wt 160.9 lb

## 2013-07-27 DIAGNOSIS — Z4889 Encounter for other specified surgical aftercare: Secondary | ICD-10-CM | POA: Insufficient documentation

## 2013-07-27 DIAGNOSIS — Z9889 Other specified postprocedural states: Secondary | ICD-10-CM

## 2013-07-27 NOTE — Progress Notes (Signed)
NAME:  Molly [name]  MRN:  [id number]  DOB:  1955/09/11  DOS:  07/27/2013    Subjective  Molly [name] is a 58 y.o. year old female s/p left BAHA for SSD.  Doing well, pain responds well to ibuprofen.  No drainage.    Allergies   Allergen Reactions    Bactrim [Sulfamethoxazole-Trimethoprim] Rash    Ciprofloxacin Rash     Current Outpatient Prescriptions   Medication Sig    Atenolol-Chlorthalidone (TENORETIC) 50-25 mg Oral Tablet 1 Tab Once a day     HYDROcodone-acetaminophen (NORCO) 5-325 mg Oral Tablet Take 1 Tab by mouth Every 4 hours as needed    hydrOXYzine pamoate (VISTARIL) 25 mg Oral Capsule Take 25 mg by mouth Three times a day as needed for Itching    levothyroxine (SYNTHROID) 88 mcg Oral Tablet Every morning     LUTEIN ORAL Take by mouth    multivitamin Oral Tablet Take 1 Tab by mouth Once a day    Omega-3 Fatty Acids-Vitamin E (FISH OIL) 1,000 mg Oral Capsule Take 1,000 mg by mouth Twice daily    potassium chloride (MICRO K) 10 mEq Oral Capsule, Sustained Release 30 mEq Once a day     pravastatin (PRAVACHOL) 40 mg Oral Tablet Every evening        Objective  Filed Vitals:    07/27/13 0935   BP: 130/70   Pulse: 78   Temp: 37.1 C (98.7 F)   Height: 1.435 m (4' 8.5")   Weight: 73 kg (160 lb 15 oz)     Well appearing and in no apparent distress    Ears: Binocular microscopy performed.   Left: healing cap removed along with xeroform gauze.  Incision CDI, sutures removed.  Area cleaned and bacitracin ointment placed.  Healing well, no S/Sx infection.    Assessment:  S/p left BAHA    Plan:  Discussed LWC.  RTC 4 weeks for recheck and BAHA activation.    Hilliard Clark, MD  [place] Department of Otolaryngology    PCP: Loyola Mast, MD  [place]  [facility] [address]  REF: No referring provider defined for this encounter.

## 2013-08-15 ENCOUNTER — Other Ambulatory Visit (INDEPENDENT_AMBULATORY_CARE_PROVIDER_SITE_OTHER): Payer: Self-pay | Admitting: Otolaryngology

## 2013-08-15 DIAGNOSIS — Z9621 Cochlear implant status: Secondary | ICD-10-CM

## 2013-08-28 ENCOUNTER — Ambulatory Visit: Payer: BC Managed Care – PPO | Attending: Otolaryngology | Admitting: Otolaryngology-Audiology

## 2013-08-28 ENCOUNTER — Ambulatory Visit (HOSPITAL_BASED_OUTPATIENT_CLINIC_OR_DEPARTMENT_OTHER): Payer: BC Managed Care – PPO | Admitting: Otolaryngology

## 2013-08-28 DIAGNOSIS — H9192 Unspecified hearing loss, left ear: Secondary | ICD-10-CM

## 2013-08-28 DIAGNOSIS — H918X9 Other specified hearing loss, unspecified ear: Secondary | ICD-10-CM

## 2013-08-28 DIAGNOSIS — Z48810 Encounter for surgical aftercare following surgery on the sense organs: Secondary | ICD-10-CM | POA: Insufficient documentation

## 2013-08-28 DIAGNOSIS — H905 Unspecified sensorineural hearing loss: Secondary | ICD-10-CM | POA: Insufficient documentation

## 2013-08-28 DIAGNOSIS — H9190 Unspecified hearing loss, unspecified ear: Secondary | ICD-10-CM

## 2013-08-28 DIAGNOSIS — Z9621 Cochlear implant status: Secondary | ICD-10-CM

## 2013-08-28 DIAGNOSIS — Z9889 Other specified postprocedural states: Secondary | ICD-10-CM

## 2013-08-28 DIAGNOSIS — Z9689 Presence of other specified functional implants: Secondary | ICD-10-CM | POA: Insufficient documentation

## 2013-08-28 MED ORDER — MUPIROCIN 2 % TOPICAL OINTMENT
TOPICAL_OINTMENT | Freq: Three times a day (TID) | CUTANEOUS | Status: AC
Start: 2013-08-28 — End: 2013-09-11

## 2013-08-28 NOTE — Progress Notes (Addendum)
NAME:  Molly [name]  MRN:  [id number]  DOB:  1956/02/11  DOS:  08/28/2013    Subjective  Molly [name] is a 58 y.o. year old female status post left BAHA placement on Jul 12, 2013.  She is here for her second postop appointment and to see audiology to get the BAHA activated today.  She has been doing well since her last surgery and states that she still has some tenderness at the site when she touches the area.  She has not had any drainage.  Her daughter is helping her maintain the site by cleaning it every day.  She is no longer taking any pain medication.  She is excited to hopefully get her BAHA activated today.  She has no other complaints at this time.          Allergies   Allergen Reactions    Bactrim [Sulfamethoxazole-Trimethoprim] Rash    Ciprofloxacin Rash     Current Outpatient Prescriptions   Medication Sig    Atenolol-Chlorthalidone (TENORETIC) 50-25 mg Oral Tablet 1 Tab Once a day     hydrOXYzine pamoate (VISTARIL) 25 mg Oral Capsule Take 25 mg by mouth Three times a day as needed for Itching    levothyroxine (SYNTHROID) 88 mcg Oral Tablet Every morning     LUTEIN ORAL Take by mouth    multivitamin Oral Tablet Take 1 Tab by mouth Once a day    Omega-3 Fatty Acids-Vitamin E (FISH OIL) 1,000 mg Oral Capsule Take 1,000 mg by mouth Twice daily    potassium chloride (MICRO K) 10 mEq Oral Capsule, Sustained Release 30 mEq Once a day     pravastatin (PRAVACHOL) 40 mg Oral Tablet Every evening        Objective  Filed Vitals:    08/28/13 1542   BP: 130/62   Pulse: 71   Temp: 36.6 C (97.9 F)   Height: 1.43 m (4' 8.3")   Weight: 69.4 kg (153 lb)     Well appearing and in no apparent distress    Ears:   Right: Intact, translucent, mobile on pneumatic otoscopy  Left: Intact, translucent, mobile on pneumatic otoscopy. 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 She'll go to audiology for activation of her BAHA today. We'll have her use bactroban TID x 2 weeks on her implant site and extend this for 4 weeks if she feels irritation in this area after completing 2 weeks. We'll also have her use hibiclens 2-3 times per week to prevent further infection. RTC 2 months        Charletta Cousin, MD  PGY-4  [place] Department of Otolaryngology  Head and Neck Surgery      Hilliard Clark, MD  [place] Department of Otolaryngology    PCP: Loyola Mast, MD  [place]  [facility] [address]  REF: No referring provider defined for this encounter.    I saw and examined the patient.  I reviewed the resident's note.  I agree with the findings and plan of care as documented in the resident's note.  Any exceptions/additions are edited/noted.    Hilliard Clark, MD 08/30/2013, 14:50
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
## 2013-09-11 ENCOUNTER — Ambulatory Visit: Payer: BC Managed Care – PPO | Attending: Otolaryngology-Audiology | Admitting: Otolaryngology-Audiology

## 2013-09-11 DIAGNOSIS — H9192 Unspecified hearing loss, left ear: Secondary | ICD-10-CM

## 2013-09-11 DIAGNOSIS — Z9889 Other specified postprocedural states: Secondary | ICD-10-CM

## 2013-09-11 DIAGNOSIS — Z462 Encounter for fitting and adjustment of other devices related to nervous system and special senses: Secondary | ICD-10-CM | POA: Insufficient documentation

## 2013-09-11 DIAGNOSIS — Z9621 Cochlear implant status: Secondary | ICD-10-CM

## 2013-09-11 DIAGNOSIS — H918X9 Other specified hearing loss, unspecified ear: Secondary | ICD-10-CM

## 2013-09-11 DIAGNOSIS — H9190 Unspecified hearing loss, unspecified ear: Secondary | ICD-10-CM

## 2013-09-12 NOTE — Progress Notes (Signed)
AUDIOLOGY REPORT    PATIENT NAME: Molly [name]  CHART NUMBER: [id number]  DATE OF BIRTH:  04/22/2011  DATE OF SERVICE: 09/11/2013     HISTORY:  Molly [name] is a 58 year old female with a history of left single-sided deafness.  She underwent surgical placement of a left osseointegrated device on [phone] with initial activation of her BAHA 5 speech processor on 08/28/2013.  She is here today for a two week follow up visit to make any minor adjustments deemed necessary and also to pair her mini microphone with the processor.  We were unable to do this at the last visit because the mini microphone was not charged.   Molly [name] is doing well with her processor and has no real complaints.    ASSESSMENT: Functional gain for soft sounds was measured in sound field with the good, right ear blocked with a hearing protection ear plug.  An aided speech reception threshold was obtained at 15dB.  An aided word recognition score of 100% was obtained at a normal conversational level of 55dB.  Aided responses to narrow band noise were obtained in the borderline normal/normal hearing range for the frequencies 250Hz  through 8000Hz .    Molly [name]'s mini microphone was paired with her speech processor and she was given instruction on how to use the processor with the mini microphone.  Of note, the patient was charged a hearing aid fitting fee today.  This was not charged at the last visit because all of the receptionists had already left when the patient left our clinic.  This fitting fee in the amount of $495 covers any visits to audiology for the next year.    RECOMMENDATIONS:   Molly [name] should return to our clinic for a recheck of her speech processor in 6 months.  This appointment will be scheduled with Dr. Wess Botts.  Of course she can come sooner if she has any problems.    Leonia Reeves, AuD, [place] Department of Otolaryngology   09/12/2013 16:50

## 2013-10-30 ENCOUNTER — Ambulatory Visit: Payer: BC Managed Care – PPO | Attending: Otolaryngology | Admitting: Otolaryngology

## 2013-10-30 ENCOUNTER — Ambulatory Visit (HOSPITAL_BASED_OUTPATIENT_CLINIC_OR_DEPARTMENT_OTHER): Payer: BC Managed Care – PPO | Admitting: Audiologist

## 2013-10-30 VITALS — BP 106/72 | HR 70 | Temp 98.9°F | Ht <= 58 in | Wt 152.3 lb

## 2013-10-30 DIAGNOSIS — Z09 Encounter for follow-up examination after completed treatment for conditions other than malignant neoplasm: Secondary | ICD-10-CM | POA: Insufficient documentation

## 2013-10-30 DIAGNOSIS — H9190 Unspecified hearing loss, unspecified ear: Secondary | ICD-10-CM

## 2013-10-30 DIAGNOSIS — Z462 Encounter for fitting and adjustment of other devices related to nervous system and special senses: Secondary | ICD-10-CM | POA: Insufficient documentation

## 2013-10-30 DIAGNOSIS — Z9689 Presence of other specified functional implants: Secondary | ICD-10-CM | POA: Insufficient documentation

## 2013-10-30 DIAGNOSIS — Z9889 Other specified postprocedural states: Secondary | ICD-10-CM

## 2013-10-30 DIAGNOSIS — Z9621 Cochlear implant status: Secondary | ICD-10-CM

## 2013-10-30 DIAGNOSIS — Z7982 Long term (current) use of aspirin: Secondary | ICD-10-CM | POA: Insufficient documentation

## 2013-10-30 NOTE — Progress Notes (Signed)
HISTORY:  Molly [name] is a 58 year old female with a history of left single-sided deafness. She underwent surgical placement of a left osseointegrated device on [phone] with initial activation of her BAHA 5 speech processor on 08/28/2013. She was seen on 09/11/2013 for a BAHA check, where she was given her mini-microphone and functional gain was tested, via the soundbooth.    BAHA CHECK:  Molly [name] was in clinic today to follow-up with Dr. Landis Gandy, and was having difficulties with her Baha, so was added on to my schedule.  Patient reported that she was struggling recently to hear, felt that it had decreased in volume from when she was first fit with her [place].  She was connected to the computer via AirLink to the [employer].  A feedback test was attempted several times, but could not be completed; so was ultimately skipped.  BC direct was completed.  LF and overall gain was increased in the everyday, Program 1.  Also, using the "hearing mentor," I increased loudness, speech in noise, and speech quality.  Patient reported that it sounded better.      RECOMMENDATIONS:  Baha check in 6 months, or sooner if needed.  If patient is doing fine in 6 months, can wait till 1 year appointment with Dr. Landis Gandy, since Molly [name] [place] far distance.  Lydia Guiles

## 2013-10-30 NOTE — Progress Notes (Addendum)
NAME:  Molly [name]  MRN:  [id number]  DOB:  27-Aug-1955  DOS:  10/30/2013    Subjective  Molly [name] is a 58 y.o. year old female who returns for followup.  She is status post left Baha placement on Jul 12, 2013.  She was last seen on August 28, 2013, where her [place] Apo was activated.  At that time, she was having some irritation around the [place] site.  Because of this, she was instructed to use Bactroban ointment around that area.  Since that time, she has been doing great.  She says that the [place] helps her to hear much, much better than before.  She does feel like she needs minor adjustment and is seeing audiology here today.  She has not had any irritation around the site.          Allergies   Allergen Reactions    Bactrim [Sulfamethoxazole-Trimethoprim] Rash    Ciprofloxacin Rash     Current Outpatient Prescriptions   Medication Sig    aspirin (ECOTRIN) 81 mg Oral Tablet, Delayed Release (E.C.) Take 81 mg by mouth Once a day    Atenolol-Chlorthalidone (TENORETIC) 50-25 mg Oral Tablet 1 Tab Once a day     cholecalciferol, vitamin D3, 1,000 unit Oral Tablet Take 1,000 Units by mouth Once a day    hydrOXYzine pamoate (VISTARIL) 25 mg Oral Capsule Take 25 mg by mouth Three times a day as needed for Itching    levothyroxine (SYNTHROID) 88 mcg Oral Tablet Every morning     LUTEIN ORAL Take by mouth Once a day     multivitamin Oral Tablet Take 1 Tab by mouth Once a day    Omega-3 Fatty Acids-Vitamin E (FISH OIL) 1,000 mg Oral Capsule Take 2,000 mg by mouth Once a day     potassium chloride (MICRO K) 10 mEq Oral Capsule, Sustained Release 40 mEq Once a day     pravastatin (PRAVACHOL) 40 mg Oral Tablet Every evening        Objective  Filed Vitals:    10/30/13 1322   BP: 106/72   Pulse: 70   Temp: 37.2 C (98.9 F)   Height: 1.43 m (4' 8.3")   Weight: 69.1 kg (152 lb 5.4 oz)     Well appearing and in no apparent distress    Ears: Pinnae normal shape and position.  Right: Canal clear, TM intact, no  effusion.  Left: Canal clear, TM intact, no effusion.  The BAHA post is in place in the post-auricular area.  No irritation or skin overgrowth.    Assessment:  S/P Left BAHA placement, well healed    Plan:  Follow up in 1 year.    Guss Bunde  Resident  [place] Department of Otolaryngology     Hilliard Clark, MD  [place] Department of Otolaryngology    PCP: Loyola Mast, MD  [place]  [facility] [address]  REF: No referring provider defined for this encounter.     I saw and examined the patient.  I reviewed the resident's note.  I agree with the findings and plan of care as documented in the resident's note.  Any exceptions/additions are edited/noted.    Hilliard Clark, MD 10/30/2013, 14:54

## 2013-11-24 ENCOUNTER — Other Ambulatory Visit (INDEPENDENT_AMBULATORY_CARE_PROVIDER_SITE_OTHER): Payer: Self-pay | Admitting: Otolaryngology

## 2013-11-24 MED ORDER — MUPIROCIN 2 % TOPICAL OINTMENT
TOPICAL_OINTMENT | Freq: Three times a day (TID) | CUTANEOUS | Status: DC
Start: 2013-11-24 — End: 2014-06-04

## 2013-11-24 MED ORDER — CEPHALEXIN 500 MG CAPSULE
500.00 mg | ORAL_CAPSULE | Freq: Three times a day (TID) | ORAL | Status: AC
Start: 2013-11-24 — End: 2013-12-08

## 2013-11-24 NOTE — Telephone Encounter (Signed)
Patient states she has yellow drainage from around the screw. The area is tender to the touch. Patient does not have a fever at this time. Per Dr. Gershon Crane, patient should schedule a follow up to be seen. Patient should continue bactroban ointment and use hibiclens shampoo. Scripts sent for bactroban refill and antibiotic. Patient verbalizes understanding at this time.

## 2013-11-24 NOTE — Telephone Encounter (Signed)
-----   Message from April Brown, [place] sent at 11/24/2013 10:05 AM EDT -----  >> Markham Jordan 11/24/2013 08:16 AM  Dr. Landis Gandy patient has a BAHA implant & states that she has an infection around the screw.  Patient is wanting to know if she needs an antibiotic.  Patient states her daughter has been cleaning it & putting bacitracin on it & it's not getting better.  Patient can be reached at [phone] or [phone] to advise.  Thank you

## 2014-01-12 ENCOUNTER — Ambulatory Visit (INDEPENDENT_AMBULATORY_CARE_PROVIDER_SITE_OTHER): Payer: Self-pay | Admitting: Otolaryngology

## 2014-01-12 NOTE — Telephone Encounter (Signed)
Spoke with patient. Patient advised to schedule an appointment to be seen in clinic, per Dr. Elisabeth Most. Patient should continue bactroban ointment and hibiclens shampoo. Patient verbalizes understanding and will call back to schedule appointment.

## 2014-01-12 NOTE — Telephone Encounter (Signed)
-----   Message from Ar[place] sent at 01/12/2014  1:52 PM EST -----  >> NICOLE LEE [facility] 01/12/2014 01:52 PM  Cassis pt             Pt is calling stating that she has an infection where her BAHA is again---she is needing something called in if possible---she states it is "oozing". Please call and advise. Thanks!         Preferred Pharmacy     [facility] [address], [place]    [place] [address]    Phone: [phone] Fax: [phone]    Open 24 Hours?: No

## 2014-01-15 ENCOUNTER — Ambulatory Visit (INDEPENDENT_AMBULATORY_CARE_PROVIDER_SITE_OTHER): Payer: Self-pay | Admitting: Otolaryngology

## 2014-01-15 NOTE — Telephone Encounter (Signed)
-----   Message from Ardeen [place] sent at 01/12/2014  4:55 PM EST -----  >> NICOLE LEE [facility] 01/12/2014 04:55 PM  Pt is calling back stating that she "cannot wait until December for an appt because it is an open wound". She wants to speak to "Dr Gardiner Barefoot associate" she spoke to earlier. I let her know that she would get a call back on Monday. Thanks!    >> NICOLE LEE [facility] 01/12/2014 01:52 PM  Cassis pt             Pt is calling stating that she has an infection where her BAHA is again---she is needing something called in if possible---she states it is "oozing". Please call and advise. Thanks!         Preferred Pharmacy     [facility] [address] [place]    [place] [address]    Phone: [phone] Fax: [phone]    Open 24 Hours?: No

## 2014-01-15 NOTE — Telephone Encounter (Signed)
-----   Message from [place] sent at 01/15/2014  3:23 PM EST -----  >> STEPHANIE GAMBLE 01/15/2014 03:23 PM  Pt is calling again and states that she has not heard anything and would like a call back     >> NICOLE LEE [facility] 01/12/2014 04:55 PM  Pt is calling back stating that she "cannot wait until December for an appt because it is an open wound". She wants to speak to "Dr Gardiner Barefoot associate" she spoke to earlier. I let her know that she would get a call back on Monday. Thanks!    >> NICOLE LEE [facility] 01/12/2014 01:52 PM  Cassis pt             Pt is calling stating that she has an infection where her BAHA is again---she is needing something called in if possible---she states it is "oozing". Please call and advise. Thanks!         Preferred Pharmacy     [facility] [address], [place]    [place] [address]    Phone: [phone] Fax: [phone]    Open 24 Hours?: No

## 2014-01-15 NOTE — Telephone Encounter (Signed)
02/19/14 (Mon) 3:45 PM 15 min Cassis, Omelia [place], MD OTOLARYNGOLOGY-POC  Patient advised to see PCP or local urgent care until she can be seen by Dr. Landis Gandy. Appointment Scheduled. Patient can call to check if any sooner appointments become available or if symptoms worsen. Verbalizes understanding. No additional questions at this time.

## 2014-02-19 ENCOUNTER — Encounter (INDEPENDENT_AMBULATORY_CARE_PROVIDER_SITE_OTHER): Payer: BC Managed Care – PPO | Admitting: Otolaryngology

## 2014-04-04 ENCOUNTER — Encounter (INDEPENDENT_AMBULATORY_CARE_PROVIDER_SITE_OTHER): Payer: BC Managed Care – PPO | Admitting: Otolaryngology

## 2014-05-02 ENCOUNTER — Ambulatory Visit (INDEPENDENT_AMBULATORY_CARE_PROVIDER_SITE_OTHER): Payer: BC Managed Care – PPO | Admitting: Audiologist

## 2014-05-04 ENCOUNTER — Encounter (INDEPENDENT_AMBULATORY_CARE_PROVIDER_SITE_OTHER): Payer: BC Managed Care – PPO | Admitting: Otolaryngology

## 2014-05-04 ENCOUNTER — Ambulatory Visit (HOSPITAL_BASED_OUTPATIENT_CLINIC_OR_DEPARTMENT_OTHER): Payer: BC Managed Care – PPO | Admitting: Otolaryngology

## 2014-05-04 ENCOUNTER — Ambulatory Visit: Payer: BC Managed Care – PPO | Attending: Otolaryngology | Admitting: Audiologist

## 2014-05-04 VITALS — BP 120/72 | HR 76 | Temp 97.8°F | Ht <= 58 in | Wt 144.4 lb

## 2014-05-04 DIAGNOSIS — T8579XA Infection and inflammatory reaction due to other internal prosthetic devices, implants and grafts, initial encounter: Secondary | ICD-10-CM

## 2014-05-04 DIAGNOSIS — M869 Osteomyelitis, unspecified: Secondary | ICD-10-CM

## 2014-05-04 DIAGNOSIS — Z9629 Presence of other otological and audiological implants: Secondary | ICD-10-CM | POA: Insufficient documentation

## 2014-05-04 DIAGNOSIS — M868X8 Other osteomyelitis, other site: Secondary | ICD-10-CM | POA: Insufficient documentation

## 2014-05-04 DIAGNOSIS — H9192 Unspecified hearing loss, left ear: Secondary | ICD-10-CM

## 2014-05-04 MED ORDER — DOXYCYCLINE HYCLATE 100 MG TABLET
100.00 mg | ORAL_TABLET | Freq: Two times a day (BID) | ORAL | Status: AC
Start: 2014-05-04 — End: 2014-05-18

## 2014-05-04 MED ORDER — MUPIROCIN CALCIUM 2 % TOPICAL CREAM
TOPICAL_CREAM | Freq: Three times a day (TID) | CUTANEOUS | Status: AC
Start: 2014-05-04 — End: 2014-05-18

## 2014-05-04 NOTE — Addendum Note (Signed)
Addended by: Maddyson Keil M on: 05/04/2014 05:21 PM     Modules accepted: Level of Service

## 2014-05-05 NOTE — Progress Notes (Addendum)
[place], [place] [address]                                PATIENT NAME: Molly [name], Molly [name] [facility] HGDJME:[id number]  DATE OF SERVICE:05/04/2014  DATE OF BIRTH: 03/04/1955    PROGRESS NOTE    SUBJECTIVE:  This is a 59 y.o. female who presents after Baha placement performed in February 2015.  The patient presents today reporting some excoriation to her [place] site.  She reports the area is painful.  She started to notice drainage on Tuesday and severe pain.  She called the office and was started on hydrogen peroxide to the wound and bacitracin ointment.  She reports some marginal improvement in her symptoms since then and thinks that the excoriation might have decreased in size.  This is the sixth time she has had excoriation to her Baha site.  She has also not been wearing the Baha since the skin has started to break down.      OBJECTIVE:    Vitals:  BP 120/72 mmHg   Pulse 76   Temp(Src) 36.6 C (97.8 F) (Thermal Scan)   Ht 1.43 m (4' 8.3")   Wt 65.5 kg (144 lb 6.4 oz)   BMI 32.03 kg/m2  General:  Well-developed, well-appearing female in no acute distress.  Ears:  Bilateral pinnae normal.  Left Baha site has a small area of excoriation anteriorly inferiorly.  No purulence detected.  It appears to be improved from pictures previously shown from last Tuesday.    Left EAC is patent without inflammation.  Left TM is translucent, midposition.  Right EAC is patent without inflammation.  Right TM is translucent, mid position.    ASSESSMENT:  Left Baha site skin breakdown.      PLAN:  1.  The patient will start Bactroban ointment to the [place] site.  2.  The patient will also use topical steroids to the area for 2 weeks.    3.  The patient will follow with Dr. Landis Gandy in 1 month.      Alois Cliche, MD  Resident  [place] Department of Otolaryngology    Hilliard Clark, MD  Assistant  Professor  [place] Department of Otolaryngology    CC:    PCP: Loyola Mast, MD  [address] [place]  [facility] [address]    REF: No referring provider defined for this encounter.        I saw and examined the patient.  I reviewed the resident's note.  I agree with the findings and plan of care as documented in the resident's note.  Any exceptions/additions are edited/noted.    Hilliard Clark, MD 05/07/2014, 09:40

## 2014-05-09 NOTE — Progress Notes (Signed)
BAHA CHECK:  Molly [name] presents today in clinic for a BAHA recheck, and to see Dr. Landis Gandy for infection around the abutment area.  Patient currently wears a BAHA 5 SN# [id number].  She reports that she has not been wearing the processor consistently because of tenderness around the abutment area.  She states that sometimes she does really well, and other times she does not.  Her BAHA was connected to the Cochlear BAHA software via the AirLink: BC direct was completed; feedback analyzer was completed; increased low and mid frequency gain; increased noise reduction.  I re-counseled the patient on realistic expectations with the BAHA.    RECOMMENDATIONS:  To follow-up with Dr. Landis Gandy for infection.  She will try out the changes and I can add her on to my schedule if she needs additional changes made to the processor; if not, BAHA check in 6 months (or the same time as visit with Dr. Landis Gandy).  Lydia Guiles

## 2014-06-04 ENCOUNTER — Ambulatory Visit: Payer: BC Managed Care – PPO | Attending: Otolaryngology | Admitting: Otolaryngology

## 2014-06-04 VITALS — BP 114/72 | HR 69 | Temp 98.8°F | Ht <= 58 in | Wt 153.7 lb

## 2014-06-04 DIAGNOSIS — H9192 Unspecified hearing loss, left ear: Secondary | ICD-10-CM

## 2014-06-04 DIAGNOSIS — M869 Osteomyelitis, unspecified: Secondary | ICD-10-CM | POA: Insufficient documentation

## 2014-06-04 DIAGNOSIS — Z9621 Cochlear implant status: Secondary | ICD-10-CM | POA: Insufficient documentation

## 2014-06-04 DIAGNOSIS — T8579XA Infection and inflammatory reaction due to other internal prosthetic devices, implants and grafts, initial encounter: Secondary | ICD-10-CM

## 2014-06-04 MED ORDER — MUPIROCIN 2 % TOPICAL OINTMENT
TOPICAL_OINTMENT | Freq: Every day | CUTANEOUS | Status: DC
Start: 2014-06-04 — End: 2021-05-26

## 2014-06-04 NOTE — Progress Notes (Addendum)
NAME:  Molly [name]  MRN:  [id number]  DOB:  Sep 08, 1955  DOS:  06/04/2014    Subjective  Molly [name] is a 59 y.o. year old female who presents for a 64-month followup for a BAHA site infection. The initial BAHA placement was performed in February 2015.  Last month (March 2016), she was found to have tenderness and purulent discharge from her BAHA site on the left temporal bone.  [name] was started on Bactroban and topical steroids over the wound.  Since then she has felt that she has had significant improvement.   The site is no longer as tender. She is no longer taking the bactroban or topical steroid ointment. She believes her infection has resolved. With regard to the [facility] itself, she does have some left-sided hearing loss compared to the right, but otherwise feels much better since surgery.    Of note, she does complain of a recent cold.  She believes that the cold is unrelated to this current BAHA infection.  She is not on antibiotics for her cold, and is taking an OTC cold medication. Her cold symptoms include sinus pain, rhinorrhea, sore throat, stuffy nose and cough.  She has no other complaints at this time.        Allergies   Allergen Reactions    Bactrim [Sulfamethoxazole-Trimethoprim] Rash    Ciprofloxacin Rash     Current Outpatient Prescriptions   Medication Sig    aspirin (ECOTRIN) 81 mg Oral Tablet, Delayed Release (E.C.) Take 81 mg by mouth Once a day    Atenolol-Chlorthalidone (TENORETIC) 50-25 mg Oral Tablet 1 Tab Once a day     cholecalciferol, vitamin D3, 1,000 unit Oral Tablet Take 1,000 Units by mouth Once a day    hydrOXYzine pamoate (VISTARIL) 25 mg Oral Capsule Take 25 mg by mouth Three times a day as needed for Itching    levothyroxine (SYNTHROID) 88 mcg Oral Tablet Every morning     LUTEIN ORAL Take by mouth Once a day     multivitamin Oral Tablet Take 1 Tab by mouth Once a day    mupirocin (BACTROBAN) 2 % Ointment by Apply Topically route Once a day     Omega-3 Fatty Acids-Vitamin E (FISH OIL) 1,000 mg Oral Capsule Take 2,000 mg by mouth Once a day     potassium chloride (MICRO K) 10 mEq Oral Capsule, Sustained Release 40 mEq Once a day     pravastatin (PRAVACHOL) 40 mg Oral Tablet Every evening        Objective  Filed Vitals:    06/04/14 1250   BP: 114/72   Pulse: 69   Temp: 37.1 C (98.8 F)   TempSrc: Tympanic   Height: 1.448 m (4\' 9" )   Weight: 69.7 kg (153 lb 10.6 oz)     General:  Well-developed, well-appearing female in no acute distress.  Ears:  Bilateral pinnae normal.  Left Baha site has a small area of excoriation anteriorly inferiorly.  Mild purulence detected compared to last evaluation, cleaned with peroxide on exam.   Left EAC is patent without inflammation.  Left TM is translucent, midposition. Minimal cerumen removal.  Right EAC is patent without inflammation.  Right TM is translucent, mid position. Minimal cerumen removal.    Review of Studies (labs, audiograms, imaging):   N/A     Assessment:  Molly [name] is a 59 y.o. F who presents with left-sided skin breakdown around her BAHA site. She is improving overall, but  still has some mild inflammation anterior/inferiorly with pustular discharge.    Plan:  1. Patient will continue bactroban ointment over BAHA site for next month once a day, stop steroid cream  2. Patient will follow-up with Dr. Landis Gandy in 1 month.    Annett Gula, MD 06/04/2014 13:46     CC:  PCP: Loyola Mast, MD  [place]  [place] [id number]  REF: No referring provider defined for this encounter.      I saw and examined the patient.  I reviewed the resident's note.  I agree with the findings and plan of care as documented in the resident's note.  Any exceptions/additions are edited/noted.    Hilliard Clark, MD 06/06/2014, 12:38

## 2014-07-13 ENCOUNTER — Encounter (INDEPENDENT_AMBULATORY_CARE_PROVIDER_SITE_OTHER): Payer: BC Managed Care – PPO | Admitting: Otolaryngology

## 2014-07-26 ENCOUNTER — Encounter (INDEPENDENT_AMBULATORY_CARE_PROVIDER_SITE_OTHER): Payer: BC Managed Care – PPO | Admitting: Otolaryngology

## 2014-09-28 ENCOUNTER — Encounter (INDEPENDENT_AMBULATORY_CARE_PROVIDER_SITE_OTHER): Payer: BC Managed Care – PPO | Admitting: Otolaryngology

## 2014-10-26 ENCOUNTER — Ambulatory Visit: Payer: BC Managed Care – PPO | Attending: Otolaryngology | Admitting: Otolaryngology

## 2014-10-26 ENCOUNTER — Ambulatory Visit (HOSPITAL_BASED_OUTPATIENT_CLINIC_OR_DEPARTMENT_OTHER): Payer: BC Managed Care – PPO | Admitting: Audiologist

## 2014-10-26 VITALS — BP 130/62 | HR 68 | Temp 98.6°F | Ht <= 58 in | Wt 155.2 lb

## 2014-10-26 DIAGNOSIS — Z7982 Long term (current) use of aspirin: Secondary | ICD-10-CM | POA: Insufficient documentation

## 2014-10-26 DIAGNOSIS — Z9629 Presence of other otological and audiological implants: Secondary | ICD-10-CM | POA: Insufficient documentation

## 2014-10-26 DIAGNOSIS — T8579XA Infection and inflammatory reaction due to other internal prosthetic devices, implants and grafts, initial encounter: Secondary | ICD-10-CM

## 2014-10-26 DIAGNOSIS — H9192 Unspecified hearing loss, left ear: Secondary | ICD-10-CM

## 2014-10-26 DIAGNOSIS — Z09 Encounter for follow-up examination after completed treatment for conditions other than malignant neoplasm: Secondary | ICD-10-CM | POA: Insufficient documentation

## 2014-10-26 DIAGNOSIS — M869 Osteomyelitis, unspecified: Secondary | ICD-10-CM

## 2014-10-26 NOTE — Progress Notes (Signed)
NAME:  Molly [name]  MRN:  [id number]  DOB:  06-17-1955  DOS:  10/26/2014    Subjective  Molly [name] is a 59 y.o. year old female s/p left BAHA May 2015.  Missed BAHA re-check after recent infection.  Doing well, no c/o.  BAHA working well, she uses it routinely.    Allergies   Allergen Reactions    Bactrim [Sulfamethoxazole-Trimethoprim] Rash    Ciprofloxacin Rash     Current Outpatient Prescriptions   Medication Sig    aspirin (ECOTRIN) 81 mg Oral Tablet, Delayed Release (E.C.) Take 81 mg by mouth Once a day    Atenolol-Chlorthalidone (TENORETIC) 50-25 mg Oral Tablet 1 Tab Once a day     cholecalciferol, vitamin D3, 1,000 unit Oral Tablet Take 1,000 Units by mouth Once a day    cyanocobalamin (VITAMIN B 12) 1,000 mcg Oral Tablet Take 1,000 mcg by mouth Once a day    hydrOXYzine pamoate (VISTARIL) 25 mg Oral Capsule Take 25 mg by mouth Three times a day as needed for Itching    levothyroxine (SYNTHROID) 88 mcg Oral Tablet Every morning     LUTEIN ORAL Take by mouth Once a day     multivitamin Oral Tablet Take 1 Tab by mouth Once a day    mupirocin (BACTROBAN) 2 % Ointment by Apply Topically route Once a day (Patient not taking: Reported on 10/26/2014)    Omega-3 Fatty Acids-Vitamin E (FISH OIL) 1,000 mg Oral Capsule Take 2,000 mg by mouth Once a day     potassium chloride (MICRO K) 10 mEq Oral Capsule, Sustained Release 40 mEq Once a day     pravastatin (PRAVACHOL) 40 mg Oral Tablet Every evening     sertraline (ZOLOFT) 25 mg Oral Tablet Take 25 mg by mouth Once a day       Objective  Filed Vitals:    10/26/14 1400   BP: 130/62   Pulse: 68   Temp: 37 C (98.6 F)   TempSrc: Thermal Scan   Height: 1.448 m (4' 9.01")   Weight: 70.4 kg (155 lb 3.3 oz)     Well appearing and in no apparent distress    Ears:   Left: BAHA site well healed, minimal crust at base.  This was cleaned.  Otherwise looks great.    Assessment:  Left BAHA infection, now resolved    Plan:  LWC discussed, RTC PRN.    Hilliard Clark, MD  [place] [facility] of Otolaryngology    PCP: Loyola Mast, MD  [address] [place]  [facility] [id number]  REF: Self, Referral  No address on file
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
## 2015-07-30 ENCOUNTER — Ambulatory Visit: Payer: BC Managed Care – PPO | Attending: Audiologist | Admitting: Audiologist

## 2015-07-30 DIAGNOSIS — Z45320 Encounter for adjustment and management of bone conduction device: Secondary | ICD-10-CM | POA: Insufficient documentation

## 2015-07-30 DIAGNOSIS — H9192 Unspecified hearing loss, left ear: Secondary | ICD-10-CM

## 2015-07-30 DIAGNOSIS — H9042 Sensorineural hearing loss, unilateral, left ear, with unrestricted hearing on the contralateral side: Secondary | ICD-10-CM | POA: Insufficient documentation

## 2015-07-31 NOTE — Progress Notes (Signed)
BAHA CHECK:    HISTORY: Molly [name] is a 60 year old female with a history of left SSD. She currently wears a left BAHA 5 processor. Molly [name] presents today in clinic for a BAHA check.  She reports doing well with the BAHA; however, she still reports getting re-occurring infections around the abutment site.      BAHA CHECK: BAHA 5 (SN# [id number]) was connected to the Cochlear BAHA software via the AirLink: BC direct was completed; no other changes were made.  Also again suggested using baby wipes to clean BAHA area.    AUDIOGRAM: Puretone air and speech testing was completed to assure no change in the hearing in the right ear.  Hearing is WNL to a mild HF, SNHL, with excellent WR (100%) in the right ear.  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  Little to no change in hearing from previous audiogram in 2015.     RECOMMENDATIONS: BAHA check in 6 months to 1 year or sooner if needed. Lydia Guiles

## 2015-08-21 ENCOUNTER — Encounter: Payer: Self-pay | Admitting: Emergency Medicine

## 2015-08-21 ENCOUNTER — Emergency Department
Admission: EM | Admit: 2015-08-21 | Discharge: 2015-08-21 | Disposition: A | Discharge status: Home / Self Care | Payer: No Typology Code available for payment source | Attending: Emergency Medicine | Admitting: Emergency Medicine

## 2015-08-21 ENCOUNTER — Emergency Department: Payer: No Typology Code available for payment source

## 2015-08-21 DIAGNOSIS — S3991XA Unspecified injury of abdomen, initial encounter: Secondary | ICD-10-CM | POA: Diagnosis present

## 2015-08-21 DIAGNOSIS — Y939 Activity, unspecified: Secondary | ICD-10-CM | POA: Diagnosis not present

## 2015-08-21 DIAGNOSIS — I119 Hypertensive heart disease without heart failure: Secondary | ICD-10-CM | POA: Insufficient documentation

## 2015-08-21 DIAGNOSIS — Y999 Unspecified external cause status: Secondary | ICD-10-CM | POA: Diagnosis not present

## 2015-08-21 DIAGNOSIS — Y92410 Unspecified street and highway as the place of occurrence of the external cause: Secondary | ICD-10-CM | POA: Diagnosis not present

## 2015-08-21 DIAGNOSIS — S301XXA Contusion of abdominal wall, initial encounter: Secondary | ICD-10-CM | POA: Diagnosis not present

## 2015-08-21 HISTORY — DX: Pure hypercholesterolemia, unspecified: E78.00

## 2015-08-21 HISTORY — DX: Essential (primary) hypertension: I10

## 2015-08-21 HISTORY — DX: Endocarditis, valve unspecified: I38

## 2015-08-21 MED ORDER — IBUPROFEN 600 MG PO TABS: 600.0000 mg | ORAL_TABLET | Freq: Four times a day (QID) | ORAL | Status: AC | PRN

## 2015-08-21 MED ORDER — BACLOFEN 10 MG PO TABS: 5.0000 mg | ORAL_TABLET | Freq: Three times a day (TID) | ORAL | Status: AC
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
## 2017-12-07 ENCOUNTER — Ambulatory Visit (INDEPENDENT_AMBULATORY_CARE_PROVIDER_SITE_OTHER): Payer: Self-pay | Admitting: Audiologist

## 2017-12-07 DIAGNOSIS — H905 Unspecified sensorineural hearing loss: Secondary | ICD-10-CM

## 2017-12-07 NOTE — Telephone Encounter (Signed)
-----   Message from [place] sent at 12/03/2017 11:18 AM EDT -----  Regarding: FW: Irving Shows  ----- Message from Markham Jordan sent at 12/03/2017 10:27 AM EDT -----  Patient is requesting an appointment to have her BAHA adjusted.  I wasn't sure if she needed an order or see a provider since she hasn't been seen for a few years.  Patient can be reached at [phone].  Thank you

## 2017-12-07 NOTE — Telephone Encounter (Signed)
Rager, Lynwood [facility]            Ria Comment is requesting an order for an audio on patient who is scheduled for a HAC incase she may need it. Thank you      Order placed. Pt already scheduled.  Enid Skeens, RN 12/07/2017 15:06

## 2017-12-14 ENCOUNTER — Ambulatory Visit: Payer: BC Managed Care – PPO | Attending: Audiologist | Admitting: Audiologist

## 2017-12-14 DIAGNOSIS — Z45321 Encounter for adjustment and management of cochlear device: Secondary | ICD-10-CM | POA: Insufficient documentation

## 2017-12-14 DIAGNOSIS — H9192 Unspecified hearing loss, left ear: Secondary | ICD-10-CM

## 2017-12-21 NOTE — Progress Notes (Signed)
BAHA CHECK:    HISTORY: Molly [name] is a 62 year old female with a history of left SSD. She currently wears a left BAHA 5 processor. [name] presents today in clinic for a BAHA check.  She reports doing ok with BAHA, but had several issues recently (as it was not working well while her daughter was in the hospital, before she passed away).     BAHA CHECK: BAHA 5 (SN# [id number]) was connected to the Cochlear BAHA software via the AirLink: BC direct was completed; tried to adjust the hearing aid; but was having no changes helping.  Also again suggested using baby wipes to clean BAHA area.    UNAIDED AUDIOGRAM: [facility] and speech testing only. Hearing is WNL to a HF mild SNHL, with excellent WR (100%) in the R ear. Profound SNHL, with no measureable WR (0%) in the L ear.     BAHA repair was started via [email or web address]    RECOMMENDATIONS: I will sumbit the BAHA repair to Cochlear. BAHA check in 6 months to 1 year or sooner if needed. Lydia Guiles

## 2019-10-18 ENCOUNTER — Ambulatory Visit (INDEPENDENT_AMBULATORY_CARE_PROVIDER_SITE_OTHER): Payer: Self-pay | Admitting: Audiologist

## 2019-11-08 ENCOUNTER — Ambulatory Visit: Payer: BC Managed Care – PPO | Attending: Audiologist | Admitting: Audiologist

## 2019-11-16 ENCOUNTER — Other Ambulatory Visit (HOSPITAL_COMMUNITY): Payer: Self-pay

## 2019-11-16 LAB — EXTERNAL COVID-19 MOLECULAR RESULT: External 2019-n-CoV/SARS-CoV-2: POSITIVE — AB

## 2021-05-22 ENCOUNTER — Inpatient Hospital Stay (HOSPITAL_COMMUNITY)
Admission: RE | Admit: 2021-05-22 | Discharge: 2021-05-22 | Disposition: A | Discharge status: Home / Self Care | Payer: BC Managed Care – PPO | Source: Ambulatory Visit | Attending: Surgery | Admitting: Surgery

## 2021-05-22 ENCOUNTER — Other Ambulatory Visit (HOSPITAL_COMMUNITY): Payer: Self-pay | Admitting: Surgery

## 2021-05-22 ENCOUNTER — Other Ambulatory Visit: Payer: BC Managed Care – PPO | Attending: Surgery

## 2021-05-22 ENCOUNTER — Other Ambulatory Visit: Payer: Self-pay

## 2021-05-22 DIAGNOSIS — Z01818 Encounter for other preprocedural examination: Secondary | ICD-10-CM

## 2021-05-22 LAB — CBC WITH DIFF
BASOPHIL #: 0.1 10*3/uL (ref 0.00–2.50)
BASOPHIL %: 1 % (ref 0–3)
EOSINOPHIL #: 0.6 10*3/uL (ref 0.00–2.40)
EOSINOPHIL %: 5 % (ref 0–7)
HCT: 45.1 % (ref 37.0–47.0)
HGB: 16 g/dL (ref 12.5–16.0)
LYMPHOCYTE #: 4 10*3/uL (ref 2.10–11.00)
LYMPHOCYTE %: 29 % (ref 25–45)
MCH: 32.6 pg — ABNORMAL HIGH (ref 27.0–32.0)
MCHC: 35.5 g/dL (ref 32.0–36.0)
MCV: 91.8 fL (ref 78.0–99.0)
MONOCYTE #: 0.6 10*3/uL (ref 0.00–4.10)
MONOCYTE %: 4 % (ref 0–12)
MPV: 7.5 fL (ref 7.4–10.4)
NEUTROPHIL #: 8.7 10*3/uL (ref 4.10–29.00)
NEUTROPHIL %: 62 % (ref 40–76)
PLATELETS: 292 10*3/uL (ref 140–440)
RBC: 4.91 10*6/uL (ref 4.20–5.40)
RDW: 13.6 % (ref 11.6–14.8)
WBC: 14 10*3/uL — ABNORMAL HIGH (ref 4.0–10.5)
WBCS UNCORRECTED: 14 10*3/uL

## 2021-05-22 LAB — BASIC METABOLIC PANEL
ANION GAP: 8 mmol/L — ABNORMAL LOW (ref 10–20)
BUN/CREA RATIO: 19 (ref 6–22)
BUN: 15 mg/dL (ref 7–25)
CALCIUM: 9.6 mg/dL (ref 8.6–10.3)
CHLORIDE: 100 mmol/L (ref 98–107)
CO2 TOTAL: 30 mmol/L (ref 21–31)
CREATININE: 0.77 mg/dL (ref 0.60–1.30)
ESTIMATED GFR: 86 mL/min/{1.73_m2} (ref >59)
GLUCOSE: 134 mg/dL — ABNORMAL HIGH (ref 74–109)
OSMOLALITY, CALCULATED: 279 mOsm/kg (ref 270–290)
POTASSIUM: 3.4 mmol/L — ABNORMAL LOW (ref 3.5–5.1)
SODIUM: 138 mmol/L (ref 136–145)

## 2021-05-23 LAB — ECG 12 LEAD
Atrial Rate: 82 {beats}/min
Calculated P Axis: 73 degrees
Calculated R Axis: 82 degrees
Calculated T Axis: 31 degrees
PR Interval: 150 ms
QRS Duration: 84 ms
QT Interval: 300 ms
QTC Calculation: 350 ms
Ventricular rate: 82 {beats}/min

## 2021-05-27 ENCOUNTER — Inpatient Hospital Stay
Admission: RE | Admit: 2021-05-27 | Discharge: 2021-05-27 | Disposition: A | Discharge status: Home / Self Care | Payer: BC Managed Care – PPO | Source: Ambulatory Visit | Attending: Surgery | Admitting: Surgery

## 2021-05-27 ENCOUNTER — Ambulatory Visit (HOSPITAL_COMMUNITY): Payer: BC Managed Care – PPO | Admitting: Anesthesiology

## 2021-05-27 ENCOUNTER — Encounter (HOSPITAL_COMMUNITY): Payer: Self-pay | Admitting: Surgery

## 2021-05-27 ENCOUNTER — Other Ambulatory Visit: Payer: Self-pay

## 2021-05-27 ENCOUNTER — Encounter (HOSPITAL_COMMUNITY)
Admission: RE | Disposition: A | Discharge status: Home / Self Care | Payer: Self-pay | Source: Ambulatory Visit | Attending: Surgery

## 2021-05-27 ENCOUNTER — Other Ambulatory Visit (HOSPITAL_COMMUNITY): Payer: BC Managed Care – PPO

## 2021-05-27 ENCOUNTER — Encounter (HOSPITAL_COMMUNITY): Payer: BC Managed Care – PPO | Admitting: Surgery

## 2021-05-27 DIAGNOSIS — N6091 Unspecified benign mammary dysplasia of right breast: Secondary | ICD-10-CM | POA: Insufficient documentation

## 2021-05-27 DIAGNOSIS — R928 Other abnormal and inconclusive findings on diagnostic imaging of breast: Secondary | ICD-10-CM

## 2021-05-27 DIAGNOSIS — Q8589 Other phakomatoses, not elsewhere classified: Secondary | ICD-10-CM | POA: Insufficient documentation

## 2021-05-27 SURGERY — BIOPSY BREAST NEEDLE LOCALIZED
Anesthesia: General | Site: Breast | Laterality: Right | Wound class: Clean Wound: Uninfected operative wounds in which no inflammation occurred

## 2021-05-27 MED ORDER — PROCHLORPERAZINE EDISYLATE 10 MG/2 ML (5 MG/ML) INJECTION SOLUTION
5.0000 mg | Freq: Once | INTRAMUSCULAR | Status: DC | PRN
Start: 2021-05-27 — End: 2021-05-27

## 2021-05-27 MED ORDER — SODIUM CHLORIDE 0.9 % (FLUSH) INJECTION SYRINGE
3.0000 mL | INJECTION | INTRAMUSCULAR | Status: DC | PRN
Start: 2021-05-27 — End: 2021-05-27

## 2021-05-27 MED ORDER — DEXAMETHASONE SODIUM PHOSPHATE 4 MG/ML INJECTION SOLUTION
4.0000 mg | Freq: Once | INTRAMUSCULAR | Status: AC
Start: 2021-05-27 — End: 2021-05-27
  Administered 2021-05-27: 4 mg via INTRAVENOUS

## 2021-05-27 MED ORDER — LIDOCAINE-PRILOCAINE 2.5 %-2.5 % TOPICAL CREAM
TOPICAL_CREAM | CUTANEOUS | Status: AC
Start: 2021-05-27 — End: 2021-05-27
  Filled 2021-05-27: qty 5

## 2021-05-27 MED ORDER — ONDANSETRON HCL (PF) 4 MG/2 ML INJECTION SOLUTION
INTRAMUSCULAR | Status: AC
Start: 2021-05-27 — End: 2021-05-27
  Filled 2021-05-27: qty 2

## 2021-05-27 MED ORDER — KETOROLAC 30 MG/ML (1 ML) INJECTION SOLUTION
INTRAMUSCULAR | Status: AC
Start: 2021-05-27 — End: 2021-05-27
  Filled 2021-05-27: qty 1

## 2021-05-27 MED ORDER — ALBUTEROL SULFATE 2.5 MG/3 ML (0.083 %) SOLUTION FOR NEBULIZATION
2.5000 mg | INHALATION_SOLUTION | Freq: Once | RESPIRATORY_TRACT | Status: DC | PRN
Start: 2021-05-27 — End: 2021-05-27

## 2021-05-27 MED ORDER — HYDROCODONE 5 MG-ACETAMINOPHEN 325 MG TABLET
1.0000 | ORAL_TABLET | ORAL | 0 refills | Status: AC | PRN
Start: 2021-05-27 — End: 2021-06-03

## 2021-05-27 MED ORDER — ACETAMINOPHEN 1,000 MG/100 ML (10 MG/ML) INTRAVENOUS SOLUTION
1000.0000 mg | Freq: Four times a day (QID) | INTRAVENOUS | Status: DC | PRN
Start: 2021-05-27 — End: 2021-05-27
  Administered 2021-05-27: 1000 mg via INTRAVENOUS

## 2021-05-27 MED ORDER — ROPIVACAINE (PF) 2 MG/ML (0.2 %) INJECTION SOLUTION
INTRAMUSCULAR | Status: AC
Start: 2021-05-27 — End: 2021-05-27
  Filled 2021-05-27: qty 10

## 2021-05-27 MED ORDER — ROCURONIUM 10 MG/ML INTRAVENOUS SYRINGE WRAPPER
INJECTION | Freq: Once | INTRAVENOUS | Status: DC | PRN
Start: 2021-05-27 — End: 2021-05-27
  Administered 2021-05-27: 40 mg via INTRAVENOUS

## 2021-05-27 MED ORDER — MIDAZOLAM 5 MG/ML INJECTION WRAPPER
1.0000 mg | Freq: Once | INTRAMUSCULAR | Status: DC | PRN
Start: 2021-05-27 — End: 2021-05-27
  Administered 2021-05-27: 1 mg via INTRAVENOUS

## 2021-05-27 MED ORDER — ONDANSETRON HCL (PF) 4 MG/2 ML INJECTION SOLUTION
4.0000 mg | Freq: Once | INTRAMUSCULAR | Status: AC
Start: 2021-05-27 — End: 2021-05-27
  Administered 2021-05-27: 4 mg via INTRAVENOUS

## 2021-05-27 MED ORDER — SODIUM CHLORIDE 0.9 % (FLUSH) INJECTION SYRINGE
3.0000 mL | INJECTION | Freq: Three times a day (TID) | INTRAMUSCULAR | Status: DC
Start: 2021-05-27 — End: 2021-05-27

## 2021-05-27 MED ORDER — EPHEDRINE SULFATE 50 MG/ML INTRAVENOUS SOLUTION
Freq: Once | INTRAVENOUS | Status: DC | PRN
Start: 2021-05-27 — End: 2021-05-27
  Administered 2021-05-27 (×2): 10 mg via INTRAVENOUS

## 2021-05-27 MED ORDER — FENTANYL (PF) 50 MCG/ML INJECTION WRAPPER
25.0000 ug | INJECTION | INTRAMUSCULAR | Status: DC | PRN
Start: 2021-05-27 — End: 2021-05-27

## 2021-05-27 MED ORDER — KETOROLAC 30 MG/ML (1 ML) INJECTION SOLUTION
Freq: Once | INTRAMUSCULAR | Status: DC | PRN
Start: 2021-05-27 — End: 2021-05-27
  Administered 2021-05-27: 30 mg via INTRAVENOUS

## 2021-05-27 MED ORDER — FAMOTIDINE (PF) 20 MG/2 ML INTRAVENOUS SOLUTION
20.0000 mg | Freq: Once | INTRAVENOUS | Status: AC
Start: 2021-05-27 — End: 2021-05-27
  Administered 2021-05-27: 20 mg via INTRAVENOUS

## 2021-05-27 MED ORDER — PROPOFOL 10 MG/ML IV BOLUS
INJECTION | Freq: Once | INTRAVENOUS | Status: DC | PRN
Start: 2021-05-27 — End: 2021-05-27
  Administered 2021-05-27: 200 mg via INTRAVENOUS

## 2021-05-27 MED ORDER — FENTANYL (PF) 50 MCG/ML INJECTION SOLUTION
INTRAMUSCULAR | Status: AC
Start: 2021-05-27 — End: 2021-05-27
  Filled 2021-05-27: qty 2

## 2021-05-27 MED ORDER — LACTATED RINGERS INTRAVENOUS SOLUTION
INTRAVENOUS | Status: DC | PRN
Start: 2021-05-27 — End: 2021-05-27

## 2021-05-27 MED ORDER — KETOROLAC 30 MG/ML (1 ML) INJECTION SOLUTION
30.0000 mg | Freq: Once | INTRAMUSCULAR | Status: DC
Start: 2021-05-27 — End: 2021-05-27

## 2021-05-27 MED ORDER — SUGAMMADEX 100 MG/ML INTRAVENOUS SOLUTION
Freq: Once | INTRAVENOUS | Status: DC | PRN
Start: 2021-05-27 — End: 2021-05-27
  Administered 2021-05-27 (×2): 500 mg via INTRAVENOUS

## 2021-05-27 MED ORDER — ONDANSETRON HCL (PF) 4 MG/2 ML INJECTION SOLUTION
4.0000 mg | Freq: Once | INTRAMUSCULAR | Status: DC | PRN
Start: 2021-05-27 — End: 2021-05-27

## 2021-05-27 MED ORDER — KETOROLAC 10 MG TABLET
10.0000 mg | ORAL_TABLET | Freq: Four times a day (QID) | ORAL | 0 refills | Status: AC | PRN
Start: 2021-05-27 — End: 2021-06-01

## 2021-05-27 MED ORDER — LACTATED RINGERS INTRAVENOUS SOLUTION
INTRAVENOUS | Status: DC
Start: 2021-05-27 — End: 2021-05-27

## 2021-05-27 MED ORDER — LIDOCAINE (PF) 100 MG/5 ML (2 %) INTRAVENOUS SYRINGE
INJECTION | Freq: Once | INTRAVENOUS | Status: DC | PRN
Start: 2021-05-27 — End: 2021-05-27
  Administered 2021-05-27: 50 mg via INTRAVENOUS

## 2021-05-27 MED ORDER — DEXAMETHASONE SODIUM PHOSPHATE 4 MG/ML INJECTION SOLUTION
INTRAMUSCULAR | Status: AC
Start: 2021-05-27 — End: 2021-05-27
  Filled 2021-05-27: qty 1

## 2021-05-27 MED ORDER — ACETAMINOPHEN 1,000 MG/100 ML (10 MG/ML) INTRAVENOUS SOLUTION
INTRAVENOUS | Status: AC
Start: 2021-05-27 — End: 2021-05-27
  Filled 2021-05-27: qty 100

## 2021-05-27 MED ORDER — IPRATROPIUM 0.5 MG-ALBUTEROL 3 MG (2.5 MG BASE)/3 ML NEBULIZATION SOLN
3.0000 mL | INHALATION_SOLUTION | Freq: Once | RESPIRATORY_TRACT | Status: DC | PRN
Start: 2021-05-27 — End: 2021-05-27

## 2021-05-27 MED ORDER — FAMOTIDINE (PF) 20 MG/2 ML INTRAVENOUS SOLUTION
INTRAVENOUS | Status: AC
Start: 2021-05-27 — End: 2021-05-27
  Filled 2021-05-27: qty 2

## 2021-05-27 MED ORDER — FENTANYL (PF) 50 MCG/ML INJECTION WRAPPER
50.0000 ug | INJECTION | INTRAMUSCULAR | Status: DC | PRN
Start: 2021-05-27 — End: 2021-05-27
  Administered 2021-05-27: 50 ug via INTRAVENOUS

## 2021-05-27 MED ORDER — FENTANYL (PF) 50 MCG/ML INJECTION WRAPPER
INJECTION | Freq: Once | INTRAMUSCULAR | Status: DC | PRN
Start: 2021-05-27 — End: 2021-05-27
  Administered 2021-05-27 (×2): 50 ug via INTRAVENOUS

## 2021-05-27 MED ORDER — PHENYLEPHRINE 10 MG/ML INJECTION SOLUTION
Freq: Once | INTRAMUSCULAR | Status: DC | PRN
Start: 2021-05-27 — End: 2021-05-27
  Administered 2021-05-27 (×2): 100 ug via INTRAVENOUS

## 2021-05-27 MED ORDER — MIDAZOLAM 5 MG/ML INJECTION WRAPPER
INTRAMUSCULAR | Status: AC
Start: 2021-05-27 — End: 2021-05-27
  Filled 2021-05-27: qty 1

## 2021-05-27 SURGICAL SUPPLY — 87 items
ADH SKNCLS 2 OCTYL CYNCRLT DRMBND ADV LIQUID APPL MCBL (SUTURE/WOUND CLOSURE) ×1
APPLIER PREM SRGCLP II SUP INTLK 9.75IN ATO INTERNAL CLIP VAS LF  DISP ENDOS RADGR MRK 20 MED TI (WOUND CARE SUPPLY) ×1 IMPLANT
APPLIER SURGICLIP MED TI 9.75_134051 6EA/BX (WOUND CARE/ENTEROSTOMAL SUPPLY) ×1
BAG BIOHAZ RD 30X24IN THK3 MIL C8-10GL LLDPE INFCT WASTE CAN (MED SURG SUPPLIES) ×1
BAG BIOHAZ RD 30X24IN THK3 MIL C8-10GL LLDPE INFCT WASTE CAN PNCT RST (MED SURG SUPPLIES) ×1
BLADE 10 2 END CBNSTL SURG STRL DISP (CUTTING ELEMENTS) ×1
BLADE 10 2 END CBNSTL SURG STRL DISP (SURGICAL CUTTING SUPPLIES) ×1 IMPLANT
BLADE 15 2 END CBNSTL SURG STRL DISP (CUTTING ELEMENTS) ×1
BLADE 15 2 END CBNSTL SURG STRL DISP (SURGICAL CUTTING SUPPLIES) ×1 IMPLANT
CLEANER INSTR PREPZYME MUL-TRD CONTAINR NARSL NEUT PH BDGR (MISCELLANEOUS PT CARE ITEMS) ×1
CONTAINR 90ML LEAK RST LID PATIENT LBL PNEUM TUBE TMPR EVD (MISCELLANEOUS PT CARE ITEMS) ×2
CONTAINR 90ML LEAK RST LID PATIENT LBL PNEUM TUBE TMPR EVD SEAL STRL ORNG SPECI LF  DISP (MISCELLANEOUS PT CARE ITEMS) ×2 IMPLANT
CONTAINR PATH POLYPROP LEAK RST LID FREEZABLE 64OZ NONST LF (MISCELLANEOUS PT CARE ITEMS) ×2
CONV USE 102436 - NEEDLE HYPO  22GA 1.5IN STD MONOJECT SS POLYPROP REG BVL LL HUB UL SHRP ANTICORE BLU STRL LF  DISP (MED SURG SUPPLIES) ×2 IMPLANT
CONV USE 81861 - CONTAINR PATH POLYPROP LEAK RST LID FREEZABLE 64OZ NONST LF (MISCELLANEOUS PT CARE ITEMS) ×1 IMPLANT
CONV USE ITEM 156524 - ADH SKNCLS 2 OCTYL CYNCRLT DRMBND ADV LIQUID APPL MCBL (SUTURE/WOUND CLOSURE) ×1 IMPLANT
CONV USE ITEM 321837 - GLOVE SURG 7.5 LTX PF NONST CRM (GLOVES AND ACCESSORIES) ×1 IMPLANT
CONV USE ITEM 321852 - GLOVE SURG 6.5 LF  PLISPRN (GLOVES AND ACCESSORIES) ×1 IMPLANT
CONV USE ITEM 323185 - PAD EG 15SQ IN UNIV FOAM SPLT NONCORD ADULT 9100 SER (SURGICAL CUTTING SUPPLIES) ×1 IMPLANT
CONV USE ITEM 329146 - CLEANER INSTR PREPZYME MUL-TRD CONTAINR NARSL NEUT PH BDGR 22OZ (MISCELLANEOUS PT CARE ITEMS) ×1 IMPLANT
CONV USE ITEM 34153 - ELECTRODE ESURG BLADE PNCL 3/32IN STRL SS CAUT PSHBTN STD SHAFT LF  VEGA SER (SURGICAL CUTTING SUPPLIES) ×1 IMPLANT
CONV USE ITEM 343591 - SOLIDIFY FLUID 1500CC NONST LF  PREM SOLIDIFY + (MED SURG SUPPLIES) ×1 IMPLANT
CONV USE ITEM 45435 - STRIP SKNCLS MDSTRP FBR NYL POR MED 4X.5IN ADH HYPOALL REINF (SUTURE/WOUND CLOSURE) ×1 IMPLANT
CONV USE ITEM 81225 - BAG BIOHAZ RD 30X24IN THK3 MIL C8-10GL LLDPE INFCT WASTE CAN PNCT RST (MED SURG SUPPLIES) ×1 IMPLANT
CONV USE ITEM 98603 - SUTURE 3-0 POLYSRB 18IN VIOL BRD TIE 12 STRN PCUT ABS (SUTURE/WOUND CLOSURE) ×1 IMPLANT
COUNTER 20 CNT BLOCK ADH NEEDLE STRL LF  RD SHARP FOAM 15.75X11.5X14IN DISP (MED SURG SUPPLIES) ×1 IMPLANT
COUNTER 20 CNT BLOCK ADH NEEDLE STRL LF RD SHARP FOAM 15.75 (MED SURG SUPPLIES) ×1
COVER 53X24IN MAYOSTAND PRXM STRL DISP EQP SMS LF (DRAPE/PACKS/SHEETS/OR TOWEL) ×1 IMPLANT
COVER PROBE NEOGUARD 30X4CM RL STRL LF (MED SURG SUPPLIES) ×1 IMPLANT
COVER PROBE NEOGUARD 30X4CM RL_STRL LF (MED SURG SUPPLIES) ×1
COVER TBL 90X50IN STD SMS REINF FNFLD STRL LF  DISP (DRAPE/PACKS/SHEETS/OR TOWEL) ×2 IMPLANT
COVER TBL 90X50IN STD SMS REINF FNFLD STRL LF DISP (DRAPE/PACKS/SHEETS/OR TOWEL) ×2
DISCONTINUED NO SUB - DRESS TRNSPR 4.5X4IN FILM IV STRL LF (WOUND CARE SUPPLY) ×1 IMPLANT
DRAIN INCS 3/16IN 49IN SIL 10IN RADOPQ END PRFR H PTRN (MED SURG SUPPLIES) ×1
DRAIN INCS 3/16IN 49IN SIL 10IN RADOPQ END PRFR H PTRN TROCAR STRL LF  RND DISP (MED SURG SUPPLIES) ×1 IMPLANT
DRAPE MAYOSTAND CVR 53X24IN PR_XM LF STRL DISP EQP SMS (DRAPE/PACKS/SHEETS/OR TOWEL) ×1
DRESS TRNSPR 4.5X4IN FILM IV STRL LF (WOUND CARE/ENTEROSTOMAL SUPPLY) ×2
DRESSING NON-ADHERENT 3X8_STRL (WOUND CARE/ENTEROSTOMAL SUPPLY) ×1
DUPE USE ITEM 24312 - CANNULA LOC 5CM 30GA JABCZENSKI RGT ANG TAPER DCTGR STRL (MED SURG SUPPLIES) ×2 IMPLANT
DUPE USE ITEM 41610 - NEEDLE LOC 5CM 20GA KOPANS SPR HK WRE REINF STRL BRST LSN (MED SURG SUPPLIES) ×2 IMPLANT
ELECTRODE ESURG BLADE PNCL 3/32IN STRL SS CAUT PSHBTN STD (CUTTING ELEMENTS) ×1
EVAC LTWT LOW LEVEL SUCT STRL_LF DISP SURG WOUND DRAIN SIL (UROLOGICAL SUPPLIES) ×2 IMPLANT
GLOVE SURG 6.5 LF PF SMOOTH STRL WHT PLISPRN (GLOVES AND ACCESSORIES) ×1
GLOVE SURG 7 LF  PF STRL PLISPRN DISP (GLOVES AND ACCESSORIES) ×1 IMPLANT
GLOVE SURG 7 LF PF SMOOTH STRL WHT PLISPRN (GLOVES AND ACCESSORIES) ×1
GLOVE SURG 7.5 LF  PF SMOOTH BEAD CUF STRL GRN 12IN SENSICARE PI GRN PLISPRN PLMR ALOE THK7.9 MIL (GLOVES AND ACCESSORIES) ×2 IMPLANT
GLOVE SURG 7.5 LF PF SMOOTH BEAD CUF STRL GRN 12IN (GLOVES AND ACCESSORIES) ×2
GLOVE SURG 7.5 LTX PF SMOOTH STRL CRM (GLOVES AND ACCESSORIES) ×1
GLOVE SURG 8 LF  BEAD CUF DERMASHIELD PLISPRN (GLOVES AND ACCESSORIES) ×1 IMPLANT
GLOVE SURG 8 LF  PF BEAD CUF SMOOTH TXTR STRL GRN 12IN SENSICARE PLISPRN SYN PLMR ALOE THK7.9 MIL (GLOVES AND ACCESSORIES) ×1 IMPLANT
GLOVE SURG 8 LF PF BEAD CUF SMOOTH TXTR STRL GRN 12IN (GLOVES AND ACCESSORIES) ×2
GLOVE SURG 8 LF PF SMOOTH STRL WHT PLISPRN (GLOVES AND ACCESSORIES) ×1
GOWN SURG LRG STD LGTH REG L3 NONREINFORCE BRTHBL TWL STRL (DRAPE/PACKS/SHEETS/OR TOWEL) ×1
GOWN SURG LRG STD LGTH REG L3 NONREINFORCE BRTHBL TWL STRL LF  DISP BLU HALYARD SPECTRUM SMS (DRAPE/PACKS/SHEETS/OR TOWEL) ×1 IMPLANT
GOWN SURG XL STD LGTH L3 NONREINFORCE HKLP CLSR TWL STRL LF (DRAPE/PACKS/SHEETS/OR TOWEL) ×1
GOWN SURG XL STD LGTH L3 NONREINFORCE HKLP CLSR TWL STRL LF  DISP BLU SPECTRUM SMS (DRAPE/PACKS/SHEETS/OR TOWEL) ×1
GOWN SURG XL STD LGTH L3 NONREINFORCE HKLP CLSR TWL STRL LF DISP BLU SPECTRUM SMS (DRAPE/PACKS/SHEETS/OR TOWEL) ×1 IMPLANT
HDPE THK22 UM C40-45 GL L48 IN X W40 IN NATURAL (MISCELLANEOUS PT CARE ITEMS) ×2 IMPLANT
LABEL MED CORRECT MED LABELING SYS 4 FLG 2 SHEET 24 PRPRNT (MED SURG SUPPLIES) ×1
LABEL MED CORRECT MED LABELING SYS 4 FLG 2 SHEET 24 PRPRNT STRL (MED SURG SUPPLIES) ×1 IMPLANT
LINER SUCT MEDIVAC CRD TW LOCK LID SHTOF VALVE CAN PORT 3L LF  DISP (MED SURG SUPPLIES) ×1 IMPLANT
LINER SUCT MEDIVAC CRD TW LOCK_LID SHTOF VALVE CAN PORT 3L (MED SURG SUPPLIES) ×1
NEEDLE HYPO 22GA 1.5IN STD MONOJECT SS POLYPROP REG BVL LL (MED SURG SUPPLIES) ×2
PAD DRESS 8X3IN MDCHC NONADH NWVN LF  STRL DISP WHT (WOUND CARE SUPPLY) ×1 IMPLANT
PAD EG 15SQ IN UNIV FOAM SPLT NONCORD ADULT 9100 SER (CUTTING ELEMENTS) ×2
SOL IRRG 0.9% NACL 1000ML PLASTIC PR BTL PRSV FR DEHP-FR AQLT LF (MEDICATIONS/SOLUTIONS) ×1 IMPLANT
SOL IRRG 0.9% NACL 1000ML PRSV FR DEHP-FR STRL AQLT LF (MEDICATIONS/SOLUTIONS) ×1
SOLIDIFY FLUID 1500CC NONST LF  PREM SOLIDIFY + (MED SURG SUPPLIES) ×1
SOLIDIFY FLUID 1500CC NONST LF PREM SOLIDIFY + (MED SURG SUPPLIES) ×1
SPONGE LAP 18X18IN PREWASH RIGID TRY STRL LF  WHT (MED SURG SUPPLIES) ×1 IMPLANT
SPONGE LAP 18X18IN PREWASH RIGID TRY STRL LF WHT (MED SURG SUPPLIES) ×1
SPONGE LAP 18X4IN PREWASH RADOPQ WO RING GAUZE STRL LF (MED SURG SUPPLIES) ×2 IMPLANT
STRIP SKNCLS MDSTRP FBR NYL POR MED 4X.5IN ADH HYPOALL REINF (SUTURE/WOUND CLOSURE) ×1
SUTURE 2-0 GS-22 POLYSRB 30IN VIOL BRD COAT ABS (SUTURE/WOUND CLOSURE) ×2 IMPLANT
SUTURE 3-0 POLYSRB 18IN VIOL BRD TIE 12 STRN PCUT ABS (SUTURE/WOUND CLOSURE) ×1
SUTURE 3-0 V-20 POLYSORB 30IN VIOL BRD COAT ABS (SUTURE/WOUND CLOSURE) ×1 IMPLANT
SUTURE 3-0 V-20 POLYSRB 30IN VIOL BRD COAT ABS (SUTURE/WOUND CLOSURE) ×2
SUTURE 4-0 C-13 BIOSYN 30IN UNDYED MONOF ABS (SUTURE/WOUND CLOSURE) ×2 IMPLANT
SYRINGE LL 10ML LF  STRL GRAD N-PYRG DEHP-FR PVC FREE MED DISP (MED SURG SUPPLIES) ×2 IMPLANT
SYRINGE LL 10ML LF STRL MED D_ISP (MED SURG SUPPLIES) ×2
TOWEL 24X16IN COTTON BLU DISP SURG STRL LF (DRAPE/PACKS/SHEETS/OR TOWEL) ×4 IMPLANT
TUBE BUBBLE CONNECTING_8888280214 1EA/BX/CS (MED SURG SUPPLIES) ×1
TUBING SUCT CLR 100FT 3/16IN ARGYLE UNIV PVC NCDTV BBL NONST LF (MED SURG SUPPLIES) ×1 IMPLANT
TUBING SUCT CLR 6FT .25IN ARGYLE PVC NCDTV STR MALE FEMALE (MED SURG SUPPLIES) ×1
TUBING SUCT CLR 6FT .25IN ARGYLE PVC NCDTV STR MALE FEMALE MLD CONN STRL LF (MED SURG SUPPLIES) ×1 IMPLANT
WOUND IRRG IRRISEPT DBRD CLNSG 0.05% CHG SYSTEM STRL LF (WOUND CARE SUPPLY) ×1 IMPLANT
WOUND IRRG IRRISEPT DBRD CLNSG_0.05% CHG SYSTEM STRL LF (WOUND CARE/ENTEROSTOMAL SUPPLY) ×1

## 2021-05-27 NOTE — Anesthesia Postprocedure Evaluation (Signed)
Anesthesia Post Op Evaluation    Patient: Molly [name]  Procedure(s):  ULTRASOUND GUIDED RIGHT BREAST EXCISIONAL BIOPSY     Last Vitals:Temperature: 36.8 C (98.2 F) (05/27/21 1341)  Heart Rate: 100 (05/27/21 1355)  BP (Non-Invasive): 104/76 (05/27/21 1355)  Respiratory Rate: (!) 24 (05/27/21 1355)  SpO2: 97 % (05/27/21 1355)    No notable events documented.      Patient location during evaluation: bedside       Level of consciousness: awake    Pain score: 1

## 2021-05-27 NOTE — Anesthesia Preprocedure Evaluation (Signed)
ANESTHESIA PRE-OP EVALUATION  Planned Procedure: ULTRASOUND GUIDED RIGHT BREAST EXCISIONAL BIOPSY  (Right: Breast)  Review of Systems     anesthesia history negative     patient summary reviewed  nursing notes reviewed        Pulmonary  negative pulmonary ROS,    Cardiovascular    Hypertension, well controlled, valvular problems/murmurs, ECG reviewed and hyperlipidemia ,No peripheral edema,  Exercise Tolerance: > or = 4 METS   ,beta blocker therapy      GI/Hepatic/Renal    GERD and kidney stones        Endo/Other    hypothyroidism and obesity,      Neuro/Psych/MS    anxiety, depression     Cancer    negative hematology/oncology ROS,                   Physical Assessment      Airway       Mallampati: III    TM distance: <3 FB    Neck ROM: full  Mouth Opening: good.            Dental           (+) chipped           Pulmonary    Breath sounds clear to auscultation  (-) no rhonchi, no decreased breath sounds, no wheezes, no rales and no stridor     Cardiovascular    Rhythm: regular  Rate: Normal  (-) no friction rub, carotid bruit is not present, no peripheral edema and no murmur     Other findings            Plan  ASA 3     Planned anesthesia type: general     general anesthesia with endotracheal tube intubation    plan to administer opioids postoperatively            PONV/POV Plan:  I plan to administer pharmcologic prophalaxis antiemetics  Intravenous induction     Anesthesia issues/risks discussed are: Dental Injuries, Stroke, Nerve Injuries, Intraoperative Awareness/ Recall, Eye /Visual Loss, Aspiration, PONV, Blood Loss, Difficult Airway, Sore Throat and Cardiac Events/MI.  Anesthetic plan and risks discussed with patient  Signed consent obtained            Patient's NPO status is appropriate for Anesthesia.           Plan discussed with CRNA.

## 2021-05-27 NOTE — OR PreOp (Signed)
ATTEMPTED IV START X 2 LEFT HAND

## 2021-05-27 NOTE — Discharge Instructions (Signed)
KEEP YOUR SCHEDULED APPOINTMENT WITH DR. BARKER ON Thursday, May 29, 2021 AT 10:00 A.M.    SPONGE BATH UNTIL AFTER FOLLOW UP APPOINTMENT ON Thursday.    NO LIFTING, PULLING OR TUGGING WITH THE RIGHT ARM.    EMPTY DRAIN 2 TIMES A DAY AND RECORD DRAINAGE OUTPUT.    BE CAREFUL WITH DRAIN SO IT DOESN'T GET PULLED OUT.    CALL FOR ANY PROBLEMS OR CONCERNS.    MAY RESUME HOME MEDICATIONS AS PRESCRIBED.

## 2021-05-27 NOTE — Anesthesia Transfer of Care (Signed)
ANESTHESIA TRANSFER OF CARE   Molly [name] is a 66 y.o. ,female, Weight: 71.2 kg (157 lb)   had Procedure(s):  ULTRASOUND GUIDED RIGHT BREAST EXCISIONAL BIOPSY   performed  05/27/21   Primary Service: Rubye Oaks, MD    Past Medical History:   Diagnosis Date    Allergic rhinitis     Coronary artery disease     Hearing loss     Heartburn     spicy foods    HTN (hypertension)     Hyperlipidemia     Hypertension     Obesity     Thyroid disease     Thyroid disorder     Valvular disease     release faxed for echo      Allergy History as of 05/27/21     CIPROFLOXACIN       Noted Status Severity Type Reaction    05/30/13 [address], [place], [place] 05/30/13 Active Medium  Rash          SULFAMETHOXAZOLE-TRIMETHOPRIM       Noted Status Severity Type Reaction    05/30/13 1405 [place], [place] 05/30/13 Active Medium  Rash              I completed my transfer of care / handoff to the receiving personnel during which we discussed:  Access, Airway, All key/critical aspects of case discussed, Analgesia, Antibiotics, Expectation of post procedure, Fluids/Product, Gave opportunity for questions and acknowledgement of understanding, Labs and PMHx    Post Location: PACU                                       Report given to: Hetty Ely, RN                           Last OR Temp: Temperature: 36.8 C (98.2 F)  ABG:  POTASSIUM   Date Value Ref Range Status   05/22/2021 3.4 (L) 3.5 - 5.1 mmol/L Final   06/19/2013 3.0 (L) 3.5 - 5.1 mmol/L Final     CALCIUM   Date Value Ref Range Status   05/22/2021 9.6 8.6 - 10.3 mg/dL Final   06/19/2013 9.7 8.5 - 10.4 mg/dL Final     Calculated P Axis   Date Value Ref Range Status   05/22/2021 73 degrees Final     Calculated R Axis   Date Value Ref Range Status   05/22/2021 82 degrees Final     Calculated T Axis   Date Value Ref Range Status   05/22/2021 31 degrees Final     GLUCOSE, POINT OF CARE   Date Value Ref Range Status   06/19/2013 121 (H) 70 - 105 mg/dL Final      Comment:     Cleaned MeterNondiabeticNonfasting     Airway:* No LDAs found *  Blood pressure (!) 144/61, pulse (!) 106, temperature 36.8 C (98.2 F), resp. rate 17, height 1.448 m ('4\' 9"'$ ), weight 71.2 kg (157 lb), SpO2 95 %.

## 2021-05-27 NOTE — OR Surgeon (Signed)
Preop diagnosis:  Abnormal right mammogram.    Postop diagnosis:  Same    Procedure:  Right ultrasound wire localized excisional biopsy.    EBL:  Minimal    Findings:  Successful wire localization of mammographic abnormality, confirmed by specimen radiograph.  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    Indications:  66 year old woman presenting with an enlarging, circumscribed, nonpalpable, mammographic abnormality.  Stereotactic biopsy was performed.  No evidence of malignancy was noted.  However, pathologic findings were inconclusive, and in the face of an enlarging lesion, excisional biopsy was recommended.    Description:  With patient in the supine position she was prepped and draped in the usual fashion.  Ultrasound was used to localize the lesion.  A 5 cm hookwire was inserted into the lesion.  A curvilinear incision along Langer's lines in the right lateral breast was performed and deepened through the subcutaneous tissue using the Bovie a palpable fatty lesion was excised but was found not to be the lesion of concern.  The lesion of concern was identified just deep to this area and was excised using the Bovie.  It appeared to be "encapsulated", consistent with the expected appearance of a hamartoma.  As there was a significant dead space after excision, a Jackson-Pratt drain was brought out through a separate stab incision and the wound was closed in layers with 2-0 Vicryl and 4-0 subcuticular Biosyn Dermabond Steri-Strips Telfa and op-site.  The procedure was well tolerated.  The patient was transported in good condition to the recovery room.

## 2021-05-28 DIAGNOSIS — D241 Benign neoplasm of right breast: Secondary | ICD-10-CM

## 2021-05-28 DIAGNOSIS — N6310 Unspecified lump in the right breast, unspecified quadrant: Secondary | ICD-10-CM

## 2021-05-28 LAB — SURGICAL PATHOLOGY SPECIMEN: Clinical History: ABNORMAL

## 2021-09-08 ENCOUNTER — Other Ambulatory Visit (HOSPITAL_COMMUNITY): Payer: Self-pay | Admitting: NURSE PRACTITIONER

## 2021-09-08 DIAGNOSIS — Z13820 Encounter for screening for osteoporosis: Secondary | ICD-10-CM

## 2021-09-16 ENCOUNTER — Inpatient Hospital Stay
Admission: RE | Admit: 2021-09-16 | Discharge: 2021-09-16 | Disposition: A | Discharge status: Home / Self Care | Payer: BC Managed Care – PPO | Source: Ambulatory Visit | Attending: NURSE PRACTITIONER | Admitting: NURSE PRACTITIONER

## 2021-09-16 ENCOUNTER — Other Ambulatory Visit: Payer: Self-pay

## 2021-09-16 DIAGNOSIS — Z13820 Encounter for screening for osteoporosis: Secondary | ICD-10-CM | POA: Insufficient documentation

## 2021-09-16 DIAGNOSIS — M85851 Other specified disorders of bone density and structure, right thigh: Secondary | ICD-10-CM

## 2021-09-16 DIAGNOSIS — M85852 Other specified disorders of bone density and structure, left thigh: Secondary | ICD-10-CM

## 2022-06-01 ENCOUNTER — Encounter (HOSPITAL_COMMUNITY): Payer: Self-pay | Admitting: Surgery

## 2022-06-01 ENCOUNTER — Encounter (HOSPITAL_COMMUNITY): Payer: Medicare PPO | Admitting: Surgery

## 2022-06-01 ENCOUNTER — Encounter (HOSPITAL_COMMUNITY): Payer: Self-pay | Admitting: Certified Registered"

## 2022-06-01 ENCOUNTER — Inpatient Hospital Stay
Admission: RE | Admit: 2022-06-01 | Discharge: 2022-06-01 | Disposition: A | Discharge status: Home / Self Care | Payer: Medicare PPO | Source: Ambulatory Visit | Attending: Surgery | Admitting: Surgery

## 2022-06-01 ENCOUNTER — Encounter (HOSPITAL_COMMUNITY)
Admission: RE | Disposition: A | Discharge status: Home / Self Care | Payer: Self-pay | Source: Ambulatory Visit | Attending: Surgery

## 2022-06-01 ENCOUNTER — Other Ambulatory Visit: Payer: Self-pay

## 2022-06-01 HISTORY — DX: Endocarditis, valve unspecified: I38

## 2022-06-01 SURGERY — COLONOSCOPY
Anesthesia: General | Wound class: Clean Contaminated Wounds-The respiratory, GI, Genital, or urinary

## 2022-06-01 NOTE — OR PreOp (Signed)
Called Dr. Carmelia Roller office regarding cardiac clearance. Awaiting call back at this time.

## 2022-06-01 NOTE — Anesthesia Preprocedure Evaluation (Signed)
ANESTHESIA PRE-OP EVALUATION  Planned Procedure: COLONOSCOPY  Review of Systems     anesthesia history negative     patient summary reviewed  nursing notes reviewed        Pulmonary  negative pulmonary ROS,    Cardiovascular    Hypertension, valvular problems/murmurs, CAD, dysrhythmias, ECG reviewed, Saw Dr. Viann Shove for a Holter monitor. " Heart flutters", hyperlipidemia, mitral regurgitation and tricuspid regurgitation , Exercise Tolerance: <4 METS        GI/Hepatic/Renal    GERD        Endo/Other    hypothyroidism and obesity,      Neuro/Psych/MS   negative neuro/psych ROS,      Cancer    negative hematology/oncology ROS,               Physical Assessment      Plan

## 2022-10-01 ENCOUNTER — Other Ambulatory Visit: Payer: Self-pay

## 2022-10-12 ENCOUNTER — Encounter (HOSPITAL_COMMUNITY)
Admission: RE | Disposition: A | Discharge status: Home / Self Care | Payer: Self-pay | Source: Ambulatory Visit | Attending: Surgery

## 2022-10-12 ENCOUNTER — Inpatient Hospital Stay
Admission: RE | Admit: 2022-10-12 | Discharge: 2022-10-12 | Disposition: A | Discharge status: Home / Self Care | Payer: Medicare PPO | Source: Ambulatory Visit | Attending: Surgery | Admitting: Surgery

## 2022-10-12 ENCOUNTER — Ambulatory Visit (HOSPITAL_COMMUNITY): Payer: Medicare PPO | Admitting: Certified Registered"

## 2022-10-12 ENCOUNTER — Encounter (HOSPITAL_COMMUNITY): Payer: Self-pay | Admitting: Surgery

## 2022-10-12 ENCOUNTER — Other Ambulatory Visit: Payer: Self-pay

## 2022-10-12 ENCOUNTER — Encounter (HOSPITAL_COMMUNITY): Payer: Medicare PPO | Admitting: Surgery

## 2022-10-12 DIAGNOSIS — Z7982 Long term (current) use of aspirin: Secondary | ICD-10-CM | POA: Insufficient documentation

## 2022-10-12 DIAGNOSIS — K219 Gastro-esophageal reflux disease without esophagitis: Secondary | ICD-10-CM | POA: Insufficient documentation

## 2022-10-12 DIAGNOSIS — E039 Hypothyroidism, unspecified: Secondary | ICD-10-CM | POA: Insufficient documentation

## 2022-10-12 DIAGNOSIS — Z6833 Body mass index (BMI) 33.0-33.9, adult: Secondary | ICD-10-CM | POA: Insufficient documentation

## 2022-10-12 DIAGNOSIS — I10 Essential (primary) hypertension: Secondary | ICD-10-CM | POA: Insufficient documentation

## 2022-10-12 DIAGNOSIS — Z1211 Encounter for screening for malignant neoplasm of colon: Secondary | ICD-10-CM | POA: Insufficient documentation

## 2022-10-12 DIAGNOSIS — R011 Cardiac murmur, unspecified: Secondary | ICD-10-CM | POA: Insufficient documentation

## 2022-10-12 DIAGNOSIS — Z79899 Other long term (current) drug therapy: Secondary | ICD-10-CM | POA: Insufficient documentation

## 2022-10-12 DIAGNOSIS — E669 Obesity, unspecified: Secondary | ICD-10-CM | POA: Insufficient documentation

## 2022-10-12 DIAGNOSIS — E785 Hyperlipidemia, unspecified: Secondary | ICD-10-CM | POA: Insufficient documentation

## 2022-10-12 SURGERY — COLONOSCOPY
Anesthesia: General | Wound class: Clean Contaminated Wounds-The respiratory, GI, Genital, or urinary

## 2022-10-12 MED ORDER — LIDOCAINE (PF) 100 MG/5 ML (2 %) INTRAVENOUS SYRINGE
INJECTION | Freq: Once | INTRAVENOUS | Status: DC | PRN
Start: 2022-10-12 — End: 2022-10-12
  Administered 2022-10-12: 100 mg via INTRAVENOUS

## 2022-10-12 MED ORDER — SODIUM CHLORIDE 0.9 % INTRAVENOUS SOLUTION
INTRAVENOUS | Status: DC | PRN
Start: 2022-10-12 — End: 2022-10-12
  Administered 2022-10-12: 0 via INTRAVENOUS

## 2022-10-12 MED ORDER — PROPOFOL 10 MG/ML IV BOLUS
INJECTION | Freq: Once | INTRAVENOUS | Status: DC | PRN
Start: 2022-10-12 — End: 2022-10-12
  Administered 2022-10-12 (×2): 100 mg via INTRAVENOUS

## 2022-10-12 SURGICAL SUPPLY — 1 items: DETERGENT INSTR 22OZ TRNSPT GEL RINSE FREE NEUT PH PREKLENZ CLR PLSNT LF (MISCELLANEOUS PT CARE ITEMS) ×1 IMPLANT

## 2022-10-12 NOTE — OR Surgeon (Signed)
[facility]      Patient Name: Rylen, Choung [facility] Number: [id number]  Date of Service: 10/12/2022   Date of Birth: Apr 18, 1955      Pre-Operative Diagnosis: SCREENING     Post-Operative Diagnosis: NORMAL EXAM    Procedure(s)/Description:  COLONOSCOPY: 65784 (CPT)     Attending Surgeon: Ephriam Knuckles, MD     Anesthesia:  CRNA: Magdalene Patricia, CRNA    Anesthesia Type: .General     Specimens Removed: NONE    Patient was taken to the endoscopy suite and given appropriate intravenous sedation.  Videocolonoscope was inserted into the rectum and advanced sequentially to the level of the cecum.  Cecum was confirmed by external palpation, presence of the ileocecal valve and transillumination of the light.  Picture was taken that documents this level.  Colonoscope was then subsequently withdrawn back inspecting all mucosal surfaces.  The ascending colon, transverse colon, descending colon, and sigmoid colon were all visualized with no specific abnormality, except for a small lipoma at about 50 Cm.  Picture taken.  The scope was withdrawn back to the level of the rectum and retroflexed.  The rectal anal junction visualized with no specific abnormality.  Scope was then subsequently straightened and withdrawn.  This concluded the procedure which patient tolerated well.  Based on current recommendations, patient would not need another routine diagnostic colonoscopy for 10 years barring change in presentation and/or symptoms.    FW Dewaine Conger MD FACS

## 2022-10-12 NOTE — Discharge Instructions (Signed)
SURGICAL DISCHARGE INSTRUCTIONS     Dr. Ephriam Knuckles, MD  performed your COLONOSCOPY today at the [facility]  Findings; Lipoma at 50cm     [facility]:  Monday through Friday from 8 a.m. - 4 p.m.: [phone]    For T&D: [phone]  Between 4 p.m. - 8 a.m., weekends and holidays:  Call ER [phone]    PLEASE SEE WRITTEN HANDOUTS AS DISCUSSED BY YOUR NURSE:  Swain Acree,RN    SIGNS AND SYMPTOMS OF A WOUND / INCISION INFECTION   Be sure to watch for the following:  Increase in redness or red streaks near or around the wound or incision.  Increase in pain that is intense or severe and cannot be relieved by the pain medication that your doctor has given you.  Increase in swelling that cannot be relieved by elevation of a body part, or by applying ice, if permitted.  Increase in drainage, or if yellow / green in color and smells bad. This could be on a dressing or a cast.  Increase in fever for longer than 24 hours, or an increase that is higher than 101 degrees Fahrenheit (normal body temperature is 98 degrees Fahrenheit). The incision may feel warm to the touch.    **CALL YOUR DOCTOR IF ONE OR MORE OF THESE SIGNS / SYMPTOMS SHOULD OCCUR.    ANESTHESIA INFORMATION   ANESTHESIA -- ADULT PATIENTS:  You have received intravenous sedation / general anesthesia, and you may feel drowsy and light-headed for several hours. You may even experience some forgetfulness of the procedure. DO NOT DRIVE A MOTOR VEHICLE or perform any activity requiring complete alertness or coordination until you feel fully awake in about 24-48 hours. Do not drink alcoholic beverages for at least 24 hours. Do not stay alone, you must have a responsible adult available to be with you. You may also experience a dry mouth or nausea for 24 hours. This is a normal side effect and will disappear as the effects of the medication wear off.    REMEMBER   If you experience any difficulty breathing, chest pain,  bleeding that you feel is excessive, persistent nausea or vomiting or for any other concerns:  Call your physician Dr.  Ephriam Knuckles, MD   at [phone] . You may also ask to have the general doctor on call paged. They are available to you 24 hours a day.      SPECIAL INSTRUCTIONS / COMMENTS   No driving, cooking, cleaning or operating machinery today, you may resume normal activity tomorrow.You may eat a regular diet as tolerated today and take medication as normal.    FOLLOW-UP APPOINTMENTS   Follow-up with Dr.Barker as needed.

## 2022-10-12 NOTE — Anesthesia Postprocedure Evaluation (Signed)
Anesthesia Post Op Evaluation    Patient: Molly [name]  Procedure(s):  COLONOSCOPY    Last Vitals:Temperature: 36.1 C (97 F) (10/12/22 1132)  Heart Rate: 64 (10/12/22 1132)  BP (Non-Invasive): 112/62 (10/12/22 1132)  Respiratory Rate: 18 (10/12/22 1132)  SpO2: 94 % (10/12/22 1132)    No notable events documented.    Patient is sufficiently recovered from the effects of anesthesia to participate in the evaluation and has returned to their pre-procedure level.  Patient location during evaluation: PACU       Patient participation: complete - patient participated  Level of consciousness: awake and alert and responsive to verbal stimuli    Pain management: adequate  Airway patency: patent    Anesthetic complications: no  Cardiovascular status: acceptable  Respiratory status: acceptable  Hydration status: acceptable  Patient post-procedure temperature: Pt Normothermic   PONV Status: Absent

## 2022-10-12 NOTE — Anesthesia Preprocedure Evaluation (Signed)
ANESTHESIA PRE-OP EVALUATION  Planned Procedure: COLONOSCOPY  Review of Systems     anesthesia history negative     patient summary reviewed  nursing notes reviewed        Pulmonary  negative pulmonary ROS,    Cardiovascular    Hypertension, well controlled, valvular problems/murmurs, CAD, ECG reviewed, Dr. Renda Rolls for cardio- seen in May- echo; Holter monitor; wnl and hyperlipidemia , Exercise Tolerance: > or = 4 METS        GI/Hepatic/Renal    GERD        Endo/Other    hypothyroidism, obesity and drug induced coagulopathy,      Neuro/Psych/MS   negative neuro/psych ROS,      Cancer    negative hematology/oncology ROS,               Physical Assessment      Airway       Mallampati: III    TM distance: <3 FB    Mouth Opening: good.            Dental       Dentition intact             Pulmonary    Breath sounds clear to auscultation       Cardiovascular    Rhythm: regular  Rate: Normal       Other findings          Plan  ASA 3     Planned anesthesia type: general     general intravenous                    Intravenous induction       Anesthetic plan and risks discussed with patient  signed consent obtained          Patient's NPO status is appropriate for Anesthesia.

## 2022-10-12 NOTE — H&P (Signed)
[facility]  H&P Update Form    Kathaline, Cochenour, 67 y.o. female  Encounter Start Date:  10/12/2022  Inpatient Admission Date:   Date of Birth:  1955/04/01    10/12/2022    STOP: IF H&P IS GREATER THAN 30 DAYS FROM SURGICAL DAY COMPLETE NEW H&P IS REQUIRED.     H & P updated the day of the procedure.  1.  H&P completed within 30 days of surgical procedure and has been reviewed within 24 hours of admission but prior to surgery or a procedure requiring anesthesia services by Dr.Fred Dewaine Conger MD. the patient has been examined, and no change has occured in the patients condition since the H&P was completed.       Change in medications: No            No LMP recorded.      Comments:     2.  Patient continues to be appropiate candidate for planned surgical procedure. YES      Ephriam Knuckles, MD

## 2023-02-17 ENCOUNTER — Encounter (INDEPENDENT_AMBULATORY_CARE_PROVIDER_SITE_OTHER): Payer: Self-pay | Admitting: OTOLARYNGOLOGY

## 2023-02-17 ENCOUNTER — Ambulatory Visit: Payer: Medicare PPO | Attending: OTOLARYNGOLOGY | Admitting: OTOLARYNGOLOGY

## 2023-02-17 ENCOUNTER — Other Ambulatory Visit: Payer: Self-pay

## 2023-02-17 VITALS — Temp 97.8°F | Ht <= 58 in | Wt 162.9 lb

## 2023-02-17 DIAGNOSIS — H6122 Impacted cerumen, left ear: Secondary | ICD-10-CM | POA: Insufficient documentation

## 2023-02-17 DIAGNOSIS — H903 Sensorineural hearing loss, bilateral: Secondary | ICD-10-CM | POA: Insufficient documentation

## 2023-02-17 DIAGNOSIS — Z9621 Cochlear implant status: Secondary | ICD-10-CM | POA: Insufficient documentation

## 2023-02-17 NOTE — H&P (Signed)
[facility]  DEPARTMENT OF OTOLARYNGOLOGY - HEAD AND NECK SURGERY  CLINIC H&P    Name: Molly [name], 67 y.o. female  MRN: [id number]  Date of Birth: 1955/03/04  Date of Service: 02/17/2023    Chief Complaint:    Chief Complaint   Patient presents with    Hearing Problem     History of Present Illness: Molly [name] is a 67 y.o. female presenting today for hearing loss. Patient has history of left BAHA placement in 2015 by Dr. Laqueta Due.  She is due for an upgrade. She follows with [facility] and states they recommended following up with ENT prior to getting the upgrade. The BAHA works well for her. She is without other concerns at this time.       Past Medical History:  Past Medical History:   Diagnosis Date    Allergic rhinitis     Coronary artery disease     Headache     Hearing loss     Heartburn     spicy foods    HTN (hypertension)     Hyperlipidemia     Hypertension     Leaky heart valve     Obesity     Sinusitis     Thyroid disease     Thyroid disorder     Valvular disease     release faxed for echo         Past Surgical History:  Past Surgical History:   Procedure Laterality Date    Cochlear implant      Hx cataract removal      Hx cesarean section      Hx colonoscopy      Hx eye surgery Right     Hx hysterectomy      Hx tonsillectomy      Hx wisdom teeth extraction      Implantation bone anchored hearing aid       Medications:  Outpatient Medications Marked as Taking for the 02/17/23 encounter (Office Visit) with Leeanne Deed, MD   Medication Sig    aspirin 81 mg Oral Tablet, Chewable Chew 1 Tablet (81 mg total) Once a day    Atenolol-Chlorthalidone (TENORETIC) 50-25 mg Oral Tablet Take 1 Tablet by mouth Once a day    cholecalciferol, vitamin D3, 1,000 unit Oral Tablet Take 5 Tablets (5,000 Units total) by mouth Once a day    cyanocobalamin (VITAMIN B12) 1,000 mcg/mL Injection Solution Inject 1 mL (1,000 mcg total) under the skin Every 30 days    hydrOXYzine pamoate (VISTARIL)  25 mg Oral Capsule Take 2 Capsules (50 mg total) by mouth Every night as needed for Itching    levothyroxine (SYNTHROID) 75 mcg Oral Tablet Take 1 Tablet (75 mcg total) by mouth Once a day    loratadine (CLARITIN) 10 mg Oral Tablet Take 1 Tablet (10 mg total) by mouth Once a day    multivitamin with iron Oral Tablet Take 1 Tablet by mouth Once a day    Omega-3 Fatty Acids-Vitamin E 1,000 mg Oral Capsule Take 2 Capsules (2,000 mg total) by mouth Once a day    pravastatin (PRAVACHOL) 40 mg Oral Tablet Every evening     sertraline (ZOLOFT) 50 mg Oral Tablet Take 1 Tablet (50 mg total) by mouth Once a day    spironolactone (ALDACTONE) 25 mg Oral Tablet Take 1 Tablet (25 mg total) by mouth Once a day      Family History:  Family Medical History:  Problem Relation (Age of Onset)    Bleeding Disorders Mother    Cancer Mother, Maternal Grandparent    Diabetes Mother, Maternal Grandparent    Heart Disease Mother    Hypertension (High Blood Pressure) Mother            Social History:  Social History     Occupational History    Not on file   Tobacco Use    Smoking status: Never    Smokeless tobacco: Never   Substance and Sexual Activity    Alcohol use: No    Drug use: No    Sexual activity: Not on file     Allergies:  Allergies   Allergen Reactions    Bactrim [Sulfamethoxazole-Trimethoprim] Rash    Ciprofloxacin Rash       Review of Systems:    Do you have any fevers: no   Any weight change: no   Change in your vision: yes    Chest Pain: no   Shortness of Breath: yes Explain Shortness of Breath: "anxiety" Stomach pain: no   Urinary difficulity: no   Joint Pain: no   Skin Problems: no   Weakness or Numbness: no   Easy Bruising or Bleeding: no   Excessive Thirst: no   Seasonal Allergies: yes    All other systems reviewed and found to be negative.    Physical Exam:    Temperature: 36.6 C (97.8 F)        Height: 144.8 cm (4\' 9" ) Weight: 73.9 kg (162 lb 14.7 oz) Body mass index is 35.26 kg/m.    General Appearance:  Pleasant, cooperative, healthy, and in no acute distress.  Eyes: Conjunctivae/corneas clear, EOM's intact.  Head and Face: Normocephalic, atraumatic.  Face symmetric, no obvious lesions. BAHA abutment in place with erythema or purulence     Binocular microscopy necessary on exam today for diagnostic and/or treatment purposes; See findings below:  Pinnae: Normal shape and position.   External auditory canals:  Patent without inflammation.  Tympanic membranes:  Intact, translucent, midposition, middle ear aerated.    Nose:  External pyramid midline. Septum midline. Mucosa normal. No purulence, polyps, or crusts.   Oral Cavity/Oropharynx: No mucosal lesions, masses, or pharyngeal asymmetry.  Tonsils: Absent.  Hypopharynx/Larynx:  voice normal.  Neck:  No palpable thyroid, salivary gland, or neck masses.  Heme/Lymph:  No cervical adenopathy.  Cardiovascular:  Good perfusion of upper extremities.  No cyanosis of the hands or fingers.  Lungs: No apparent stridorous breathing. No acute distress. Equal work of breathing without any tachypnea or accessory muscle use   Skin: Skin warm and dry.  Neurologic: Cranial nerves:  grossly intact.  Psychiatric:  Alert and oriented x 3.    Review of Information:    Pathology: No results found for this or any previous visit (from the past 720 hour(s)).   Imaging:       Procedure:  ENT, PHYSICIAN OFFICE CENTER  [address]  [place] [address]  Operated by [facility]  Procedure Note    Name: Molly [name] MRN:  [id number]   Date: 02/17/2023 DOB:  [birth year] (67 y.o.)         Ear Cerumen Removal    Performed by: Towanda Malkin, PA-C  Authorized by: Towanda Malkin, PA-C    Consent:     Consent obtained:  Verbal    Consent given by:  Patient    Risks discussed:  Incomplete removal and pain    Alternatives discussed:  No treatment  Post-procedure details:     Procedure completion:  Tolerated well, no immediate complications      Bilateral EACs examined under binocular  microscopy. Tympanic membrane on the left, fully occluded. Impacted cerumen was cleaned from the left canal using curette, suction, and alligator forceps.  Patient tolerated procedure well.  Underlying anatomy was healthy.     Towanda Malkin, PA-C  02/17/2023, 11:15     Assessment:   Molly [name] is a 67 y.o. female who has history of left BAHA placed in 2015. Left cerumen impaction removed fully today. She is due for a processor upgrade and follows with [facility]. Audiogram reviewed today from [facility] 02/03/23: mild SNHL of right ear and severe to profound SNHL of left ear/       ICD-10-CM    1. 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 Impacted cerumen of left ear  H61.22 Ear Cerumen Removal      3. Sensorineural hearing loss (SNHL), bilateral  H90.3         - Medical Clearance given to patient for BAHA processor upgrade  - Continue following with [facility] for audiologic services   - Follow up in 1 year     Plan:  Orders Placed This Encounter    Ear Cerumen Removal    AUDIOLOGY EVALUATION(REF AUDIOLOGY)-POC         Towanda Malkin, PA-C 02/17/2023 11:54     I saw and examined the patient. I reviewed the PA's note. I formulated the assessment and plan, performed the decision making, and discussed it with the patient's caregiver. I agree with the findings and plan of care as documented in the PA's note. Any exceptions/additions are edited/noted.    Leeanne Deed, MD 02/17/2023 13:33       Leeanne Deed, MD    CC:    PCP Alric Ran, NP  [address] [place] [address]   Referring Provider Self, Referral  No address on file

## 2023-02-17 NOTE — Procedures (Signed)
ENT, PHYSICIAN OFFICE CENTER  [address]  [place] [address]  Operated by [facility]  Procedure Note    Name: Elisabetta Oppermann MRN:  [id number]   Date: 02/17/2023 DOB:  [birth year] (67 y.o.)         Ear Cerumen Removal    Performed by: Towanda Malkin, PA-C  Authorized by: Towanda Malkin, PA-C    Consent:     Consent obtained:  Verbal    Consent given by:  Patient    Risks discussed:  Incomplete removal and pain    Alternatives discussed:  No treatment  Post-procedure details:     Procedure completion:  Tolerated well, no immediate complications      Bilateral EACs examined under binocular microscopy. Tympanic membrane on the left, fully occluded. Impacted cerumen was cleaned from the left canal using curette, suction, and alligator forceps.  Patient tolerated procedure well.  Underlying anatomy was healthy.     Towanda Malkin, PA-C  02/17/2023, 11:15

## 2023-03-12 ENCOUNTER — Ambulatory Visit (INDEPENDENT_AMBULATORY_CARE_PROVIDER_SITE_OTHER): Payer: Self-pay | Admitting: OTOLARYNGOLOGY

## 2023-03-12 NOTE — Telephone Encounter (Signed)
Spoke to Molly [name], have not received anything. She is going to re-fax to my atttn. Denies further questions

## 2023-03-12 NOTE — Telephone Encounter (Signed)
Regarding: New Appointment  ----- Message from Jean Rosenthal sent at 03/12/2023  1:29 PM EST -----  Copied From CRM [id number].[name], Molly RUTH called to schedule an appointment.     [place] wanting to know if we received the fax she sent over on the 30th of Dec.    Was a prescription form and a request for clinicals.  Please call her at. [phone]

## 2023-04-01 ENCOUNTER — Ambulatory Visit (INDEPENDENT_AMBULATORY_CARE_PROVIDER_SITE_OTHER): Payer: Self-pay | Admitting: OTOLARYNGOLOGY

## 2023-04-01 NOTE — Telephone Encounter (Signed)
Updated ICD10 code and faxed

## 2023-04-01 NOTE — Telephone Encounter (Signed)
Regarding: Molly [name]  ----- Message from Charlann Boxer sent at 04/01/2023  8:58 AM EST -----  Copied From CRM [id number].Pt called and she said [facility] told her Dr. Edmonia [name] needs to revise the code on the paperwork they sent him and resend it.

## 2023-04-15 ENCOUNTER — Ambulatory Visit (INDEPENDENT_AMBULATORY_CARE_PROVIDER_SITE_OTHER): Payer: Self-pay | Admitting: OTOLARYNGOLOGY

## 2023-04-15 NOTE — Telephone Encounter (Addendum)
Regarding: Molly [name]  ----- Message from [place] R sent at 04/15/2023  4:28 PM EST -----  They are calling again after NOT receiving anything in regards to this. Please fax this to them again at [phone].  Please call back to confirm this was sent at [phone]. Thank you!!!    [facility] [employer] calling and stating they faxed over documentation and they needed an updated diagnosis they need the MD to initial on this. It was sent back to [place] on 1.31.25. Please get this signed and faxed to them at [phone]. Any additional questions please call back at [phone] secure VM. Thank you!!    ----- Message from Alyssa R sent at 04/07/2023  4:40 PM EST -----  Copied From CRM #[id number].  Weins, Molly [name] called with a clinical question.   [facility] calling and stating they faxed over documentation and they needed an updated diagnosis they need the MD to initial on this. It was sent back to [place] on 1.31.25. Please get this signed and faxed to them at [phone]. Any additional questions please call back at [phone] secure VM. Thank you!!      Will have Dr. Edmonia [name] initial and re-fax.

## 2023-07-12 ENCOUNTER — Other Ambulatory Visit (HOSPITAL_COMMUNITY): Payer: Self-pay | Admitting: NURSE PRACTITIONER

## 2023-07-12 DIAGNOSIS — Z13820 Encounter for screening for osteoporosis: Secondary | ICD-10-CM

## 2023-09-20 ENCOUNTER — Other Ambulatory Visit: Payer: Self-pay

## 2023-09-20 ENCOUNTER — Ambulatory Visit
Admission: RE | Admit: 2023-09-20 | Discharge: 2023-09-20 | Disposition: A | Discharge status: Home / Self Care | Source: Ambulatory Visit | Attending: NURSE PRACTITIONER

## 2023-09-20 DIAGNOSIS — Z780 Asymptomatic menopausal state: Secondary | ICD-10-CM | POA: Insufficient documentation

## 2023-09-20 DIAGNOSIS — M8588 Other specified disorders of bone density and structure, other site: Secondary | ICD-10-CM

## 2023-09-20 DIAGNOSIS — Z13820 Encounter for screening for osteoporosis: Secondary | ICD-10-CM | POA: Insufficient documentation

## 2024-02-14 ENCOUNTER — Ambulatory Visit (INDEPENDENT_AMBULATORY_CARE_PROVIDER_SITE_OTHER): Payer: Self-pay | Admitting: OTOLARYNGOLOGY

## 2024-02-17 ENCOUNTER — Ambulatory Visit: Attending: NURSE PRACTITIONER | Admitting: NURSE PRACTITIONER

## 2024-02-17 ENCOUNTER — Encounter (INDEPENDENT_AMBULATORY_CARE_PROVIDER_SITE_OTHER): Payer: Self-pay | Admitting: NURSE PRACTITIONER

## 2024-02-17 ENCOUNTER — Other Ambulatory Visit: Payer: Self-pay

## 2024-02-17 ENCOUNTER — Ambulatory Visit (INDEPENDENT_AMBULATORY_CARE_PROVIDER_SITE_OTHER): Payer: Self-pay | Admitting: NURSE PRACTITIONER

## 2024-02-17 VITALS — Ht <= 58 in | Wt 156.0 lb

## 2024-02-17 DIAGNOSIS — J341 Cyst and mucocele of nose and nasal sinus: Secondary | ICD-10-CM | POA: Insufficient documentation

## 2024-02-17 DIAGNOSIS — J343 Hypertrophy of nasal turbinates: Secondary | ICD-10-CM | POA: Insufficient documentation

## 2024-02-17 DIAGNOSIS — J309 Allergic rhinitis, unspecified: Secondary | ICD-10-CM | POA: Insufficient documentation

## 2024-02-17 MED ORDER — MOMETASONE 50 MCG/ACTUATION NASAL SPRAY: 1.0000 | Freq: Two times a day (BID) | NASAL | 3 refills | Status: AC

## 2024-02-17 MED ORDER — AZELASTINE 137 MCG (0.1 %) NASAL SPRAY: 1.0000 | Freq: Two times a day (BID) | NASAL | 11 refills | Status: AC

## 2024-02-17 NOTE — H&P (Signed)
 [facility]  [address]  [place] [address]  Phone: [phone]  Fax: [phone]      Encounter Date: 02/17/2024    Patient ID: Molly [name]  MRN: [id number]    DOB: [birth year]  Age: 68 y.o. female         Referring Provider:    Devora Greig CROME, APRN, CNP  [address]  [address]  [place],  [place] [address]    Reason for Visit:   Chief Complaint   Patient presents with    Sinus Problem     States having a CT sinus which showed abnormality. States having nasal congestion        History of Present Illness:  Molly [name] is a 68 y.o. female referred for abnormal CT sinus findings.  She complains of chronic nasal congestion and CT sinuses was done.  She has tried various antihistamines and nasal sprays in the past.      Patient History:  Problem List[1]  Current Outpatient Medications   Medication Sig    aspirin 81 mg Oral Tablet, Chewable Chew 1 Tablet (81 mg total) Once a day    Atenolol-Chlorthalidone (TENORETIC) 50-25 mg Oral Tablet Take 1 Tablet by mouth Once a day    azelastine  (ASTELIN ) 137 mcg (0.1 %) Nasal Spray, Non-Aerosol Administer 1 Spray into each nostril Twice daily Use in each nostril as directed    bran/gum/fib/cel/psyl/kelp/pec (FIBER 6 ORAL) Take by mouth    calcium  carbonate/vitamin D3 (CALTRATE 600 PLUS D ORAL) Take by mouth    clobetasol (TEMOVATE) 0.05 % Cream Apply topically Twice daily    cyanocobalamin (VITAMIN B12) 1,000 mcg/mL Injection Solution Inject 1 mL (1,000 mcg total) under the skin Every 30 days    fenofibrate nanocrystallized (TRICOR) 145 mg Oral Tablet Take 1 Tablet (145 mg total) by mouth Every morning with breakfast    hydrOXYzine pamoate (VISTARIL) 25 mg Oral Capsule Take 2 Capsules (50 mg total) by mouth Every night as needed for Itching    levothyroxine (SYNTHROID) 75 mcg Oral Tablet Take 1 Tablet (75 mcg total) by mouth Once a day    mometasone  (NASONEX) 50 mcg/actuation Nasal Spray, Non-Aerosol Administer 1 Spray into affected nostril(s) Twice daily     naproxen (NAPROSYN) 500 mg Oral Tablet Take 1 Tablet (500 mg total) by mouth Twice daily with food    Omega-3 Fatty Acids-Vitamin E 1,000 mg Oral Capsule Take 2 Capsules (2,000 mg total) by mouth Once a day    penicillin V potassium (VEETID) 500 mg Oral Tablet Take 1 Tablet (500 mg total) by mouth Four times a day    pravastatin (PRAVACHOL) 40 mg Oral Tablet Every evening     sertraline (ZOLOFT) 50 mg Oral Tablet Take 1 Tablet (50 mg total) by mouth Once a day    spironolactone (ALDACTONE) 25 mg Oral Tablet Take 1 Tablet (25 mg total) by mouth Once a day     Allergies[2]  Past Medical History:   Diagnosis Date    Allergic rhinitis     Coronary artery disease     Headache     Hearing loss     Heartburn     spicy foods    HTN (hypertension)     Hyperlipidemia     Hypertension     Leaky heart valve     Obesity     Sinusitis     Thyroid disease     Thyroid disorder     Valvular disease  release faxed for echo      Past Surgical History:   Procedure Laterality Date    COCHLEAR IMPLANT      HX CATARACT REMOVAL      HX CESAREAN SECTION      x4    HX COLONOSCOPY      HX EYE SURGERY Right     HX HYSTERECTOMY      HX TONSILLECTOMY      HX WISDOM TEETH EXTRACTION      IMPLANTATION BONE ANCHORED HEARING AID        Family Medical History:       Problem Relation (Age of Onset)    Bleeding Disorders Mother    Cancer Mother, Maternal Grandparent    Diabetes Mother, Maternal Grandparent    Heart Disease Mother    Hypertension (High Blood Pressure) Mother            Social History[3]    Review of Systems     Vitals:    02/17/24 1109   Weight: 70.8 kg (156 lb)   Height: 1.448 m (4' 9)   BMI: 33.76      ENT Physical Exam  Constitutional  Appearance: patient appears well-developed, well-nourished and well-groomed,  Communication/Voice: communication appropriate for developmental age; vocal quality normal;  Head and Face  Appearance: head appears normal, face appears normal and face appears atraumatic;  Palpation: facial palpation  normal;  Salivary: glands normal;  Ear  Hearing: intact;  Auricles: right auricle normal; left auricle normal;  External Mastoids: right external mastoid normal; left external mastoid normal;  Ear Canals: right ear canal normal; left ear canal normal;  Tympanic Membranes: right tympanic membrane normal; left tympanic membrane normal;  Nose  External Nose: nares patent bilaterally; nasal discharge visible;  Internal Nose: nasal mucosa normal; septum normal; bilateral inferior turbinates with hypertrophy;  Oral Cavity/Oropharynx  Lips: normal;  Teeth: normal;  Gums: gingiva normal;  Tongue: normal;  Oral mucosa: normal;  Hard palate: normal;  Neck  Neck: neck normal; neck palpation normal;  Thyroid: thyroid normal;  Respiratory  Inspection: breathing unlabored; normal breathing rate;  Lymphatic  Palpation: lymph nodes normal;  Neurovestibular  Mental Status: alert and oriented;  Psychiatric: mood normal; affect is appropriate;  Cranial Nerves: cranial nerves intact;       Assessment:  ENCOUNTER DIAGNOSES     ICD-10-CM   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 Chronic allergic rhinitis  J30.9       Plan:  Medical records reviewed on 02/17/2024.  Reviewed CT sinuses this shows retention cyst in the leftmaxillary sinus.  Unlikely to be the main cause of nasal congestion.  Will start Astelin  and Nasonex BID for better control of allergic rhinitis and turbinate hypertrophy.  Request copy of CT disc to review images  If no improvement in nasal congestion will consider allergy testing.    The patient was given the opportunity to ask questions and those questions were answered to the patient's satisfaction. The patient was encouraged to call with any additional questions or concerns. Discussed with patient effects and side effects of medications. The patient was informed to contact the office within 7 business days if a message/lab results/referral/imaging results have  not been conveyed to the patient   Orders Placed This Encounter    31231 - NASAL ENDOSCOPY DIAGNOSTIC UNILATERAL OR BILATERAL (AMB ONLY)    azelastine  (ASTELIN ) 137 mcg (0.1 %) Nasal Spray,  Non-Aerosol    mometasone  (NASONEX) 50 mcg/actuation Nasal Spray, Non-Aerosol     Return in about 2 months (around 04/19/2024).    Eleanor Blazer, APRN, CNP  02/17/2024, 11:27          [1] There is no problem list on file for this patient.   [2]   Allergies  Allergen Reactions    Bactrim [Sulfamethoxazole-Trimethoprim] Rash    Ciprofloxacin Rash   [3]   Social History  Tobacco Use    Smoking status: Never    Smokeless tobacco: Never   Substance Use Topics    Alcohol use: No    Drug use: No

## 2024-02-29 NOTE — Procedures (Signed)
 [facility]  [address]  [place] [address]  Operated by [facility]  Procedure Note    Name: Molly [name] MRN:  [id number]   Date: 02/17/2024 DOB:  1955/06/08 (68 y.o.)         31231 - NASAL ENDOSCOPY DIAGNOSTIC UNILATERAL OR BILATERAL (AMB ONLY)    Performed by: Ira Setter, APRN, CNP  Authorized by: Ira Setter, APRN, CNP    Time Out:     Immediately before the procedure, a time out was called:  Yes    Patient verified:  Yes    Procedure Verified:  Yes    Site Verified:  Yes  Documentation:      [facility]  [address]  [place] [address]  Operated by [facility]  Procedure Note    Name: Molly [name] MRN:  [id number]  Date: 02/17/2024 DOB:  02-08-1956 (68 y.o.)        @PROCDOC @    Indications for procedure: Obstructive nasal breathing    Anesthesia: Oxymetazoline nasal spray    Description: Nasal endoscopy with flexible scope was performed with examination of the  septum, inferior, middle, and superior meatus, turbinates, sphenoethmoidal recess, and nasopharynx.     There were no polyps, pus, or granulation tissue noted.  ET orifices and nasopharynx were normal.     Findings: Inferior turbinate hypertrophy    The patient tolerated the procedure well.    Setter Ira, APRN, CNP             Johnson Arizola, APRN, CNP

## 2024-04-18 ENCOUNTER — Ambulatory Visit (INDEPENDENT_AMBULATORY_CARE_PROVIDER_SITE_OTHER): Payer: Self-pay | Admitting: NURSE PRACTITIONER

## 2024-06-19 ENCOUNTER — Ambulatory Visit (INDEPENDENT_AMBULATORY_CARE_PROVIDER_SITE_OTHER): Admitting: OTOLARYNGOLOGY
